# Patient Record
Sex: Female | Born: 1935 | Race: White | Hispanic: No
Health system: Southern US, Community
[De-identification: ages and names within clinical notes are randomized; demographics above are authoritative.]

## PROBLEM LIST (undated history)

## (undated) DIAGNOSIS — I878 Other specified disorders of veins: Secondary | ICD-10-CM

## (undated) DIAGNOSIS — M1990 Unspecified osteoarthritis, unspecified site: Secondary | ICD-10-CM

## (undated) DIAGNOSIS — K219 Gastro-esophageal reflux disease without esophagitis: Secondary | ICD-10-CM

## (undated) DIAGNOSIS — I341 Nonrheumatic mitral (valve) prolapse: Secondary | ICD-10-CM

## (undated) DIAGNOSIS — M810 Age-related osteoporosis without current pathological fracture: Secondary | ICD-10-CM

## (undated) DIAGNOSIS — R609 Edema, unspecified: Secondary | ICD-10-CM

## (undated) DIAGNOSIS — G43909 Migraine, unspecified, not intractable, without status migrainosus: Secondary | ICD-10-CM

## (undated) DIAGNOSIS — L84 Corns and callosities: Secondary | ICD-10-CM

## (undated) DIAGNOSIS — J309 Allergic rhinitis, unspecified: Secondary | ICD-10-CM

## (undated) DIAGNOSIS — D649 Anemia, unspecified: Secondary | ICD-10-CM

## (undated) DIAGNOSIS — G5600 Carpal tunnel syndrome, unspecified upper limb: Secondary | ICD-10-CM

## (undated) DIAGNOSIS — F419 Anxiety disorder, unspecified: Secondary | ICD-10-CM

## (undated) DIAGNOSIS — I10 Essential (primary) hypertension: Secondary | ICD-10-CM

## (undated) DIAGNOSIS — R002 Palpitations: Secondary | ICD-10-CM

## (undated) HISTORY — DX: Gastro-esophageal reflux disease without esophagitis: K21.9

## (undated) HISTORY — DX: Carpal tunnel syndrome, unspecified upper limb: G56.00

## (undated) HISTORY — DX: Migraine, unspecified, not intractable, without status migrainosus: G43.909

## (undated) HISTORY — DX: Anemia, unspecified: D64.9

## (undated) HISTORY — DX: Essential (primary) hypertension: I10

## (undated) HISTORY — DX: Anxiety disorder, unspecified: F41.9

## (undated) HISTORY — DX: Allergic rhinitis, unspecified: J30.9

## (undated) HISTORY — DX: Unspecified osteoarthritis, unspecified site: M19.90

## (undated) HISTORY — DX: Edema, unspecified: R60.9

## (undated) HISTORY — DX: Other specified disorders of veins: I87.8

## (undated) HISTORY — DX: Palpitations: R00.2

## (undated) HISTORY — DX: Age-related osteoporosis without current pathological fracture: M81.0

## (undated) HISTORY — DX: Corns and callosities: L84

## (undated) HISTORY — DX: Nonrheumatic mitral (valve) prolapse: I34.1

## (undated) HISTORY — DX: Hypercalcemia: E83.52

---

## 2006-12-05 ENCOUNTER — Ambulatory Visit: Payer: Self-pay | Admitting: Specialist

## 2006-12-12 ENCOUNTER — Other Ambulatory Visit: Payer: Self-pay

## 2006-12-12 ENCOUNTER — Ambulatory Visit: Payer: Self-pay | Admitting: Specialist

## 2008-09-17 ENCOUNTER — Ambulatory Visit: Payer: Self-pay | Admitting: Cardiology

## 2010-12-08 ENCOUNTER — Ambulatory Visit: Payer: Self-pay | Admitting: Family Medicine

## 2011-02-16 ENCOUNTER — Ambulatory Visit: Payer: Self-pay | Admitting: Family Medicine

## 2011-07-26 ENCOUNTER — Ambulatory Visit: Payer: Self-pay | Admitting: Oncology

## 2011-07-26 LAB — RETICULOCYTES: Reticulocyte: 0.31 % — ABNORMAL LOW (ref 0.5–1.5)

## 2011-07-26 LAB — IRON AND TIBC
Iron Bind.Cap.(Total): 432 ug/dL (ref 250–450)
Unbound Iron-Bind.Cap.: 411 ug/dL

## 2011-07-26 LAB — CBC CANCER CENTER
Basophil #: 0.1 x10 3/mm (ref 0.0–0.1)
Basophil %: 1.4 %
Eosinophil #: 0 x10 3/mm (ref 0.0–0.7)
Eosinophil %: 1.3 %
HCT: 29.2 % — ABNORMAL LOW (ref 35.0–47.0)
HGB: 9 g/dL — ABNORMAL LOW (ref 12.0–16.0)
Lymphocyte #: 0.9 x10 3/mm — ABNORMAL LOW (ref 1.0–3.6)
MCH: 25.8 pg — ABNORMAL LOW (ref 26.0–34.0)
MCHC: 30.9 g/dL — ABNORMAL LOW (ref 32.0–36.0)
MCV: 84 fL (ref 80–100)
Platelet: 211 x10 3/mm (ref 150–440)
WBC: 3.6 x10 3/mm (ref 3.6–11.0)

## 2011-07-26 LAB — FERRITIN: Ferritin (ARMC): 12 ng/mL (ref 8–388)

## 2011-08-13 ENCOUNTER — Ambulatory Visit: Payer: Self-pay | Admitting: Oncology

## 2011-09-12 ENCOUNTER — Ambulatory Visit: Payer: Self-pay | Admitting: Oncology

## 2011-10-17 ENCOUNTER — Ambulatory Visit: Payer: Self-pay | Admitting: Oncology

## 2011-10-17 LAB — CBC CANCER CENTER
Basophil #: 0 x10 3/mm (ref 0.0–0.1)
Basophil %: 1.1 %
Eosinophil %: 1.5 %
HCT: 37.4 % (ref 35.0–47.0)
HGB: 12.3 g/dL (ref 12.0–16.0)
Lymphocyte #: 1 x10 3/mm (ref 1.0–3.6)
MCV: 92 fL (ref 80–100)
Monocyte %: 7.5 %
Neutrophil #: 2.9 x10 3/mm (ref 1.4–6.5)
RBC: 4.06 10*6/uL (ref 3.80–5.20)
RDW: 19.8 % — ABNORMAL HIGH (ref 11.5–14.5)
WBC: 4.3 x10 3/mm (ref 3.6–11.0)

## 2011-10-17 LAB — IRON AND TIBC
Iron Bind.Cap.(Total): 337 ug/dL (ref 250–450)
Unbound Iron-Bind.Cap.: 242 ug/dL

## 2011-11-13 ENCOUNTER — Ambulatory Visit: Payer: Self-pay | Admitting: Oncology

## 2013-10-31 ENCOUNTER — Emergency Department: Payer: Self-pay | Admitting: Emergency Medicine

## 2013-10-31 LAB — CBC WITH DIFFERENTIAL/PLATELET
Basophil #: 0 10*3/uL (ref 0.0–0.1)
Basophil %: 0.8 %
EOS ABS: 0.1 10*3/uL (ref 0.0–0.7)
Eosinophil %: 1.3 %
LYMPHS ABS: 0.5 10*3/uL — AB (ref 1.0–3.6)
LYMPHS PCT: 10.8 %
MONO ABS: 0.5 x10 3/mm (ref 0.2–0.9)
MONOS PCT: 10 %
NEUTROS PCT: 77.1 %
Neutrophil #: 3.8 10*3/uL (ref 1.4–6.5)

## 2013-10-31 LAB — CBC
HCT: 29.7 % — ABNORMAL LOW (ref 35.0–47.0)
HGB: 9.5 g/dL — AB (ref 12.0–16.0)
MCH: 27.7 pg (ref 26.0–34.0)
MCHC: 31.9 g/dL — ABNORMAL LOW (ref 32.0–36.0)
MCV: 87 fL (ref 80–100)
Platelet: 202 10*3/uL (ref 150–440)
RBC: 3.42 10*6/uL — AB (ref 3.80–5.20)
RDW: 15.4 % — ABNORMAL HIGH (ref 11.5–14.5)
WBC: 5 10*3/uL (ref 3.6–11.0)

## 2013-10-31 LAB — COMPREHENSIVE METABOLIC PANEL
ANION GAP: 8 (ref 7–16)
Albumin: 3.2 g/dL — ABNORMAL LOW (ref 3.4–5.0)
Alkaline Phosphatase: 73 U/L
BUN: 49 mg/dL — AB (ref 7–18)
Bilirubin,Total: 0.2 mg/dL (ref 0.2–1.0)
CHLORIDE: 105 mmol/L (ref 98–107)
CO2: 30 mmol/L (ref 21–32)
Calcium, Total: 9.9 mg/dL (ref 8.5–10.1)
Creatinine: 1.4 mg/dL — ABNORMAL HIGH (ref 0.60–1.30)
EGFR (African American): 40 — ABNORMAL LOW
GFR CALC NON AF AMER: 34 — AB
Glucose: 114 mg/dL — ABNORMAL HIGH (ref 65–99)
OSMOLALITY: 299 (ref 275–301)
Potassium: 4.2 mmol/L (ref 3.5–5.1)
SGOT(AST): 15 U/L (ref 15–37)
SGPT (ALT): 13 U/L — ABNORMAL LOW
Sodium: 143 mmol/L (ref 136–145)
TOTAL PROTEIN: 6.4 g/dL (ref 6.4–8.2)

## 2013-10-31 LAB — URINALYSIS, COMPLETE
BLOOD: NEGATIVE
Bilirubin,UR: NEGATIVE
Glucose,UR: NEGATIVE mg/dL (ref 0–75)
Hyaline Cast: 2
Ketone: NEGATIVE
NITRITE: POSITIVE
PH: 5 (ref 4.5–8.0)
Protein: NEGATIVE
RBC,UR: <1 /HPF (ref 0–5)
Specific Gravity: 1.012 (ref 1.003–1.030)
Squamous Epithelial: 5
WBC UR: 7 /HPF (ref 0–5)

## 2013-10-31 LAB — TROPONIN I: Troponin-I: 0.04 ng/mL

## 2014-08-01 ENCOUNTER — Emergency Department: Payer: Medicare Other

## 2014-08-01 ENCOUNTER — Emergency Department
Admission: EM | Admit: 2014-08-01 | Discharge: 2014-08-01 | Disposition: A | Discharge status: Home / Self Care | Payer: Medicare Other | Attending: Emergency Medicine | Admitting: Emergency Medicine

## 2014-08-01 DIAGNOSIS — S0001XA Abrasion of scalp, initial encounter: Secondary | ICD-10-CM | POA: Insufficient documentation

## 2014-08-01 DIAGNOSIS — W010XXA Fall on same level from slipping, tripping and stumbling without subsequent striking against object, initial encounter: Secondary | ICD-10-CM | POA: Insufficient documentation

## 2014-08-01 DIAGNOSIS — Y998 Other external cause status: Secondary | ICD-10-CM | POA: Diagnosis not present

## 2014-08-01 DIAGNOSIS — Y92129 Unspecified place in nursing home as the place of occurrence of the external cause: Secondary | ICD-10-CM | POA: Diagnosis not present

## 2014-08-01 DIAGNOSIS — Y9389 Activity, other specified: Secondary | ICD-10-CM | POA: Diagnosis not present

## 2014-08-01 DIAGNOSIS — S0990XA Unspecified injury of head, initial encounter: Secondary | ICD-10-CM | POA: Diagnosis present

## 2014-08-01 NOTE — ED Notes (Signed)
Pt here from [facility], pt was found on the bathroom. Pt had an unwitnessed fall. Pt has laceration to the back of her head and a small skin teat to the left arm.  Pt has dementia at baseline and pt in NAD at this time.

## 2014-08-01 NOTE — ED Notes (Signed)
Jody from [facility] called for update about patient.  Call back number is [phone].

## 2014-08-01 NOTE — ED Notes (Signed)
Pt has bilateral edema to lower extremities.

## 2014-08-01 NOTE — Discharge Instructions (Signed)
Head Injury °You have received a head injury. It does not appear serious at this time. Headaches and vomiting are common following head injury. It should be easy to awaken from sleeping. Sometimes it is necessary for you to stay in the emergency department for a while for observation. Sometimes admission to the hospital may be needed. After injuries such as yours, most problems occur within the first 24 hours, but side effects may occur up to 7-10 days after the injury. It is important for you to carefully monitor your condition and contact your health care provider or seek immediate medical care if there is a change in your condition. °WHAT ARE THE TYPES OF HEAD INJURIES? °Head injuries can be as minor as a bump. Some head injuries can be more severe. More severe head injuries include: °· A jarring injury to the brain (concussion). °· A bruise of the brain (contusion). This mean there is bleeding in the brain that can cause swelling. °· A cracked skull (skull fracture). °· Bleeding in the brain that collects, clots, and forms a bump (hematoma). °WHAT CAUSES A HEAD INJURY? °A serious head injury is most likely to happen to someone who is in a car wreck and is not wearing a seat belt. Other causes of major head injuries include bicycle or motorcycle accidents, sports injuries, and falls. °HOW ARE HEAD INJURIES DIAGNOSED? °A complete history of the event leading to the injury and your current symptoms will be helpful in diagnosing head injuries. Many times, pictures of the brain, such as CT or MRI are needed to see the extent of the injury. Often, an overnight hospital stay is necessary for observation.  °WHEN SHOULD I SEEK IMMEDIATE MEDICAL CARE?  °You should get help right away if: °· You have confusion or drowsiness. °· You feel sick to your stomach (nauseous) or have continued, forceful vomiting. °· You have dizziness or unsteadiness that is getting worse. °· You have severe, continued headaches not relieved by  medicine. Only take over-the-counter or prescription medicines for pain, fever, or discomfort as directed by your health care provider. °· You do not have normal function of the arms or legs or are unable to walk. °· You notice changes in the black spots in the center of the colored part of your eye (pupil). °· You have a clear or bloody fluid coming from your nose or ears. °· You have a loss of vision. °During the next 24 hours after the injury, you must stay with someone who can watch you for the warning signs. This person should contact local emergency services (911 in the [place] if you have seizures, you become unconscious, or you are unable to wake up. °HOW CAN I PREVENT A HEAD INJURY IN THE FUTURE? °The most important factor for preventing major head injuries is avoiding motor vehicle accidents.  To minimize the potential for damage to your head, it is crucial to wear seat belts while riding in motor vehicles. Wearing helmets while bike riding and playing collision sports (like football) is also helpful. Also, avoiding dangerous activities around the house will further help reduce your risk of head injury.  °WHEN CAN I RETURN TO NORMAL ACTIVITIES AND ATHLETICS? °You should be reevaluated by your health care provider before returning to these activities. If you have any of the following symptoms, you should not return to activities or contact sports until 1 week after the symptoms have stopped: °· Persistent headache. °· Dizziness or vertigo. °· Poor attention and concentration. °· Confusion. °·   Memory problems.  Nausea or vomiting.  Fatigue or tire easily.  Irritability.  Intolerant of bright lights or loud noises.  Anxiety or depression.  Disturbed sleep. MAKE SURE YOU:   Understand these instructions.  Will watch your condition.  Will get help right away if you are not doing well or get worse. Document Released: 02/28/2005 Document Revised: 03/05/2013 Document Reviewed:  11/05/2012 [facility] Patient Information 2015 [place], [place]. This information is not intended to replace advice given to you by your health care provider. Make sure you discuss any questions you have with your health care provider.  Head Injury You have received a head injury. It does not appear serious at this time. Headaches and vomiting are common following head injury. It should be easy to awaken from sleeping. Sometimes it is necessary for you to stay in the emergency department for a while for observation. Sometimes admission to the hospital may be needed. After injuries such as yours, most problems occur within the first 24 hours, but side effects may occur up to 7-10 days after the injury. It is important for you to carefully monitor your condition and contact your health care provider or seek immediate medical care if there is a change in your condition. WHAT ARE THE TYPES OF HEAD INJURIES? Head injuries can be as minor as a bump. Some head injuries can be more severe. More severe head injuries include:  A jarring injury to the brain (concussion).  A bruise of the brain (contusion). This mean there is bleeding in the brain that can cause swelling.  A cracked skull (skull fracture).  Bleeding in the brain that collects, clots, and forms a bump (hematoma). WHAT CAUSES A HEAD INJURY? A serious head injury is most likely to happen to someone who is in a car wreck and is not wearing a seat belt. Other causes of major head injuries include bicycle or motorcycle accidents, sports injuries, and falls. HOW ARE HEAD INJURIES DIAGNOSED? A complete history of the event leading to the injury and your current symptoms will be helpful in diagnosing head injuries. Many times, pictures of the brain, such as CT or MRI are needed to see the extent of the injury. Often, an overnight hospital stay is necessary for observation.  WHEN SHOULD I SEEK IMMEDIATE MEDICAL CARE?  You should get help right away  if:  You have confusion or drowsiness.  You feel sick to your stomach (nauseous) or have continued, forceful vomiting.  You have dizziness or unsteadiness that is getting worse.  You have severe, continued headaches not relieved by medicine. Only take over-the-counter or prescription medicines for pain, fever, or discomfort as directed by your health care provider.  You do not have normal function of the arms or legs or are unable to walk.  You notice changes in the black spots in the center of the colored part of your eye (pupil).  You have a clear or bloody fluid coming from your nose or ears.  You have a loss of vision. During the next 24 hours after the injury, you must stay with someone who can watch you for the warning signs. This person should contact local emergency services (911 in the [place] if you have seizures, you become unconscious, or you are unable to wake up. HOW CAN I PREVENT A HEAD INJURY IN THE FUTURE? The most important factor for preventing major head injuries is avoiding motor vehicle accidents. To minimize the potential for damage to your head, it is crucial to  wear seat belts while riding in motor vehicles. Wearing helmets while bike riding and playing collision sports (like football) is also helpful. Also, avoiding dangerous activities around the house will further help reduce your risk of head injury.  WHEN CAN I RETURN TO NORMAL ACTIVITIES AND ATHLETICS? You should be reevaluated by your health care provider before returning to these activities. If you have any of the following symptoms, you should not return to activities or contact sports until 1 week after the symptoms have stopped:  Persistent headache.  Dizziness or vertigo.  Poor attention and concentration.  Confusion.  Memory problems.  Nausea or vomiting.  Fatigue or tire easily.  Irritability.  Intolerant of bright lights or loud noises.  Anxiety or depression.  Disturbed sleep. MAKE  SURE YOU:   Understand these instructions.  Will watch your condition.  Will get help right away if you are not doing well or get worse. Document Released: 02/28/2005 Document Revised: 03/05/2013 Document Reviewed: 11/05/2012 [facility] Patient Information 2015 [place], [place]. This information is not intended to replace advice given to you by your health care provider. Make sure you discuss any questions you have with your health care provider.

## 2014-08-01 NOTE — ED Notes (Signed)
Pt here after fall at assisted living, pt in NAD at this time. Pt has small laceration to the left side of head and skin tear to the left arm. Bleeding controlled at this time.

## 2014-08-01 NOTE — ED Provider Notes (Signed)
[facility] Emergency Department Provider Note  ____________________________________________  Time seen: 2:35 AM  I have reviewed the triage vital signs and the nursing notes.   HISTORY  Chief Complaint Fall      HPI Colleen Miranda is a 81 y.o. female presents with history of slip [facility]. Patient struck left parietal regions: Fall. Per EMS patient had baseline at present.    No past medical history on file.  There are no active problems to display for this patient.   No past surgical history on file.  No current outpatient prescriptions on file.  Allergies Review of patient's allergies indicates not on file.  No family history on file.  Social History History  Substance Use Topics  . Smoking status: Not on file  . Smokeless tobacco: Not on file  . Alcohol Use: Not on file    Review of Systems  Constitutional: Negative for fever. Eyes: Negative for visual changes. ENT: Negative for sore throat. Cardiovascular: Negative for chest pain. Respiratory: Negative for shortness of breath. Gastrointestinal: Negative for abdominal pain, vomiting and diarrhea. Genitourinary: Negative for dysuria. Musculoskeletal: Negative for back pain. Skin: Negative for rash. Abrasion to her scalp Neurological: Negative for headaches, focal weakness or numbness.   10-point ROS otherwise negative.  ____________________________________________   PHYSICAL EXAM:  VITAL SIGNS: ED Triage Vitals  Enc Vitals Group     BP 08/01/14 0231 102/43 mmHg     Pulse Rate 08/01/14 0231 83     Resp 08/01/14 0231 18     Temp 08/01/14 0231 97.9 F (36.6 C)     Temp Source 08/01/14 0231 Oral     SpO2 08/01/14 0231 95 %     Weight --      Height --      Head Cir --      Peak Flow --      Pain Score 08/01/14 0231 0     Pain Loc --      Pain Edu? --      Excl. in GC? --      Constitutional: Alert and  oriented. Well appearing and in no distress. Eyes: Conjunctivae are normal. PERRL. Normal extraocular movements. ENT   Head: Normocephalic and atraumatic.   Nose: No congestion/rhinnorhea.   Mouth/Throat: Mucous membranes are moist.   Neck: No stridor. Cardiovascular: Normal rate, regular rhythm. Normal and symmetric distal pulses are present in all extremities. No murmurs, rubs, or gallops. Respiratory: Normal respiratory effort without tachypnea nor retractions. Breath sounds are clear and equal bilaterally. No wheezes/rales/rhonchi. Gastrointestinal: Soft and nontender. No distention. There is no CVA tenderness. Genitourinary: deferred Musculoskeletal: Nontender with normal range of motion in all extremities. No joint effusions.  No lower extremity tenderness nor edema. Neurologic:  Normal speech and language. No gross focal neurologic deficits are appreciated. Speech is normal.  Skin:  Skin is warm, dry and intact. No rash noted. Left parietal parietal scalp abrasion Psychiatric: Mood and affect are normal. Speech and behavior are normal. Patient exhibits appropriate insight and judgment.  ____________________________________________      RADIOLOGY  CT head and cervical spine reveal no acute pathology.  ____________________________________________     INITIAL IMPRESSION / ASSESSMENT AND PLAN / ED COURSE  Pertinent labs & imaging results that were available during my care of the patient were reviewed by me and considered in my medical decision making (see chart for details).  History of physical exam consistent with accidental slip  and fall with resultant left parietal scalp abrasion.  ____________________________________________   FINAL CLINICAL IMPRESSION(S) / ED DIAGNOSES  Final diagnoses:  Scalp abrasion, initial encounter      Darci Currentandolph N Dayanne Yiu, MD 08/01/14 [phone]

## 2014-08-14 ENCOUNTER — Telehealth: Payer: Self-pay

## 2014-08-14 NOTE — Telephone Encounter (Signed)
[facility] called stated pt is very anxious and needs to be seen ASAP. I explained to the caregiver our current schedule is tight and there are no openings until the week of 08/25/14. Caregiver stated MAC told her to tell the scheduler to create room for this pt to be seen. Caregiver asks that MAC return her call Colleen Miranda(Colleen Miranda-Director @ [facility]) [phone]. Thanks.

## 2014-08-14 NOTE — Telephone Encounter (Signed)
Spoke with Liborio NixonJanice and informed that MAC to call family

## 2014-08-14 NOTE — Telephone Encounter (Signed)
Call and discuss will talk with family

## 2014-08-25 ENCOUNTER — Encounter: Payer: Medicare Other | Admitting: Family Medicine

## 2014-08-25 NOTE — Progress Notes (Signed)
This encounter was created in error - please disregard.

## 2014-09-06 ENCOUNTER — Encounter: Payer: Self-pay | Admitting: Emergency Medicine

## 2014-09-06 ENCOUNTER — Emergency Department
Admission: EM | Admit: 2014-09-06 | Discharge: 2014-09-06 | Disposition: A | Discharge status: Home / Self Care | Payer: Medicare Other | Attending: Emergency Medicine | Admitting: Emergency Medicine

## 2014-09-06 DIAGNOSIS — Z7982 Long term (current) use of aspirin: Secondary | ICD-10-CM | POA: Insufficient documentation

## 2014-09-06 DIAGNOSIS — Z043 Encounter for examination and observation following other accident: Secondary | ICD-10-CM | POA: Diagnosis not present

## 2014-09-06 DIAGNOSIS — I10 Essential (primary) hypertension: Secondary | ICD-10-CM | POA: Insufficient documentation

## 2014-09-06 DIAGNOSIS — W1839XA Other fall on same level, initial encounter: Secondary | ICD-10-CM | POA: Diagnosis not present

## 2014-09-06 DIAGNOSIS — Y998 Other external cause status: Secondary | ICD-10-CM | POA: Diagnosis not present

## 2014-09-06 DIAGNOSIS — Y9389 Activity, other specified: Secondary | ICD-10-CM | POA: Insufficient documentation

## 2014-09-06 DIAGNOSIS — Z79899 Other long term (current) drug therapy: Secondary | ICD-10-CM | POA: Insufficient documentation

## 2014-09-06 DIAGNOSIS — Y9289 Other specified places as the place of occurrence of the external cause: Secondary | ICD-10-CM | POA: Insufficient documentation

## 2014-09-06 DIAGNOSIS — W19XXXA Unspecified fall, initial encounter: Secondary | ICD-10-CM

## 2014-09-06 NOTE — ED Notes (Signed)
Patient from [facility]. Patient was pushing on a door to go to the restroom when someone else "pulled on the same door" causing her to fall. Patient states "I was only on the floor for 5-10 minutes". Patient denies Pain, Discomfort, or LOC. Here for evaluation only per request of [facility] staff

## 2014-09-06 NOTE — Discharge Instructions (Signed)
Fall Prevention and Home Safety °Falls cause injuries and can affect all age groups. It is possible to prevent falls.  °HOW TO PREVENT FALLS °· Wear shoes with rubber soles that do not have an opening for your toes. °· Keep the inside and outside of your house well lit. °· Use night lights throughout your home. °· Remove clutter from floors. °· Clean up floor spills. °· Remove throw rugs or fasten them to the floor with carpet tape. °· Do not place electrical cords across pathways. °· Put grab bars by your tub, shower, and toilet. Do not use towel bars as grab bars. °· Put handrails on both sides of the stairway. Fix loose handrails. °· Do not climb on stools or stepladders, if possible. °· Do not wax your floors. °· Repair uneven or unsafe sidewalks, walkways, or stairs. °· Keep items you use a lot within reach. °· Be aware of pets. °· Keep emergency numbers next to the telephone. °· Put smoke detectors in your home and near bedrooms. °Ask your doctor what other things you can do to prevent falls. °Document Released: 12/25/2008 Document Revised: 08/30/2011 Document Reviewed: 05/31/2011 °ExitCare® Patient Information ©2015 ExitCare, LLC. This information is not intended to replace advice given to you by your health care provider. Make sure you discuss any questions you have with your health care provider. ° °

## 2014-09-06 NOTE — ED Provider Notes (Signed)
[facility] Emergency Department Provider Note  ____________________________________________  Time seen: 1625  I have reviewed the triage vital signs and the nursing notes.   HISTORY  Chief Complaint Fall     HPI Colleen Miranda is a 81 y.o. female who lives at [facility]. She walks with a walker. She approached the door to a bathroom. She did not know someone was inside or about to open the door. While she pushed on the door to open it the door opened from the other side and this threw her off balance. She fell to the ground slowly with some support from the walker. She denies hitting her head. She did not have any injury to arms or legs according to the patient. The staff at [facility] did call 911. The first responders immobilized patient on a backboard with a c-collar. This 81 year old was uncomfortable with that and had tried to tell him that she was not injured. Here in the emergency department, she is alert and communicative and continues to deny any acute injury.    Past Medical History  Diagnosis Date  . Arthritis   . Osteoporosis   . Venous stasis   . GERD (gastroesophageal reflux disease)   . Migraine   . Allergic rhinitis   . Anemia   . Edema   . Hypertension   . Palpitations   . MVP (mitral valve prolapse)   . Anxiety   . Corns and callosities   . Carpal tunnel syndrome   . Hypercalcemia     There are no active problems to display for this patient.   History reviewed. No pertinent past surgical history.  Current Outpatient Rx  Name  Route  Sig  Dispense  Refill  . aspirin EC 81 MG tablet   Oral   Take 81 mg by mouth daily.         . Cholecalciferol (VITAMIN D-3 PO)   Oral   Take by mouth.         . furosemide (LASIX) 20 MG tablet   Oral   Take 20 mg by mouth.         Marland Kitchen lisinopril (PRINIVIL,ZESTRIL) 5 MG tablet   Oral   Take 5 mg by mouth daily.         . metoprolol succinate  (TOPROL-XL) 25 MG 24 hr tablet   Oral   Take 25 mg by mouth daily.         Marland Kitchen omeprazole (PRILOSEC OTC) 20 MG tablet   Oral   Take 20 mg by mouth daily.           Allergies Naprosyn  Family History  Problem Relation Age of Onset  . Family history unknown: Yes    Social History History  Substance Use Topics  . Smoking status: Not on file  . Smokeless tobacco: Not on file  . Alcohol Use: Not on file    Review of Systems  Constitutional: Negative for fever. ENT: Negative for sore throat. Cardiovascular: Negative for chest pain. Respiratory: Negative for shortness of breath. Gastrointestinal: Negative for abdominal pain, vomiting and diarrhea. Genitourinary: Negative for dysuria. Musculoskeletal: No myalgias or injuries. Skin: Negative for rash. Neurological: Negative for headaches   10-point ROS otherwise negative.  ____________________________________________   PHYSICAL EXAM:  VITAL SIGNS: ED Triage Vitals  Enc Vitals Group     BP 09/06/14 1623 122/58 mmHg     Pulse Rate 09/06/14 1623 71     Resp --  Temp 09/06/14 1623 97.8 F (36.6 C)     Temp Source 09/06/14 1623 Oral     SpO2 09/06/14 1623 92 %     Weight 09/06/14 1623 112 lb 14.4 oz (51.211 kg)     Height --      Head Cir --      Peak Flow --      Pain Score --      Pain Loc --      Pain Edu? --      Excl. in GC? --     Constitutional:  Alert and communicative. She has a strong accent and is a little difficult to understand but she has an intact thought process and denies any acute injury. ENT   Head: Normocephalic and atraumatic.   Nose: No congestion/rhinnorhea.   Mouth/Throat: Mucous membranes are moist. Cardiovascular: Normal rate, regular rhythm, no murmur noted Respiratory:  Normal respiratory effort, no tachypnea.    Breath sounds are clear and equal bilaterally.  Gastrointestinal: Soft and nontender. No distention.  Back: No muscle spasm, no tenderness, no CVA  tenderness. Noted kyphosis. Musculoskeletal: No deformity noted. Nontender with normal range of motion in all extremities.  No noted edema. Hips are stable. No tenderness, no crepitus. Good range of motion of her legs. Neurologic:  Normal speech and language. No gross focal neurologic deficits are appreciated.  Skin:  Skin is warm, dry. No rash noted. No abrasions, no lacerations. Psychiatric: Mood and affect are normal. Speech and behavior are normal.  ____________________________________________    INITIAL IMPRESSION / ASSESSMENT AND PLAN / ED COURSE  Well-appearing patient who has been cleared from the backboard. She is able to sit up on her own. She has no neck tenderness. She has no focal injury. She is looking forward or turning to [facility].     ____________________________________________   FINAL CLINICAL IMPRESSION(S) / ED DIAGNOSES  Final diagnoses:  Fall, initial encounter      Darien Ramus, MD 09/06/14 [phone]

## 2014-09-09 ENCOUNTER — Ambulatory Visit: Payer: Medicare Other | Admitting: Family Medicine

## 2014-10-24 ENCOUNTER — Telehealth: Payer: Self-pay

## 2014-10-24 NOTE — Telephone Encounter (Signed)
[facility] needs clarification on medications sent to the pharmacy. Also wants to know if eye drops can be changed to something less expensive. Pt is in an assisted living facility. Need very specific orders on all medications. Thanks.

## 2014-10-27 ENCOUNTER — Other Ambulatory Visit: Payer: Self-pay | Admitting: Family Medicine

## 2014-10-27 MED ORDER — FUROSEMIDE 20 MG PO TABS: 20.0000 mg | ORAL_TABLET | Freq: Every day | ORAL | Status: DC

## 2014-10-27 MED ORDER — LISINOPRIL 5 MG PO TABS: 5.0000 mg | ORAL_TABLET | Freq: Every day | ORAL | Status: DC

## 2014-10-27 MED ORDER — METOPROLOL SUCCINATE ER 25 MG PO TB24: 25.0000 mg | ORAL_TABLET | Freq: Every day | ORAL | Status: AC

## 2014-10-27 MED ORDER — OMEPRAZOLE MAGNESIUM 20 MG PO TBEC: 20.0000 mg | DELAYED_RELEASE_TABLET | Freq: Every day | ORAL | Status: DC

## 2015-01-22 ENCOUNTER — Telehealth: Payer: Self-pay | Admitting: Family Medicine

## 2015-01-22 ENCOUNTER — Other Ambulatory Visit: Payer: Self-pay | Admitting: Family Medicine

## 2015-01-22 MED ORDER — DIPHENHYDRAMINE HCL 25 MG PO CAPS: 25.0000 mg | ORAL_CAPSULE | Freq: Every evening | ORAL | Status: DC | PRN

## 2015-01-22 NOTE — Progress Notes (Unsigned)
Will try Benadryl for sleep

## 2015-01-22 NOTE — Telephone Encounter (Signed)
[address]

## 2015-01-22 NOTE — Telephone Encounter (Signed)
I will order Benadryl don't know where to send

## 2015-01-22 NOTE — Addendum Note (Signed)
Addended byVonita Moss: Charlynn Salih on: 01/22/2015 04:26 PM   Modules accepted: Orders

## 2015-01-22 NOTE — Telephone Encounter (Signed)
Pt refuses to take remron because she has nightmares and hallucinations and they would like to know if the pt could have something else to take.

## 2015-01-22 NOTE — Telephone Encounter (Signed)
Routing to provider.   Patient refuses to take remron (mirtazipine) because she has nightmares and hallucinations.Caregivers want to know if she could have something else.  LAST VISIT: 03/13/2014 She had another appointment 09/09/2014--but no showed.   I do not see in practice partner or in epic where Dr. Dossie Arbourrissman prescribed remron. (in history either). Not sure if I'm not looking hard enough or not.    Practice Partner # [phone]

## 2015-06-20 ENCOUNTER — Emergency Department: Payer: Medicare Other

## 2015-06-20 ENCOUNTER — Encounter: Payer: Self-pay | Admitting: Emergency Medicine

## 2015-06-20 ENCOUNTER — Emergency Department
Admission: EM | Admit: 2015-06-20 | Discharge: 2015-06-20 | Disposition: A | Discharge status: Skilled Nursing Facility | Payer: Medicare Other | Source: Home / Self Care | Attending: Emergency Medicine | Admitting: Emergency Medicine

## 2015-06-20 DIAGNOSIS — S0093XA Contusion of unspecified part of head, initial encounter: Secondary | ICD-10-CM

## 2015-06-20 DIAGNOSIS — W06XXXA Fall from bed, initial encounter: Secondary | ICD-10-CM

## 2015-06-20 DIAGNOSIS — Y939 Activity, unspecified: Secondary | ICD-10-CM | POA: Insufficient documentation

## 2015-06-20 DIAGNOSIS — W228XXA Striking against or struck by other objects, initial encounter: Secondary | ICD-10-CM

## 2015-06-20 DIAGNOSIS — Y929 Unspecified place or not applicable: Secondary | ICD-10-CM

## 2015-06-20 DIAGNOSIS — W19XXXA Unspecified fall, initial encounter: Secondary | ICD-10-CM

## 2015-06-20 DIAGNOSIS — K219 Gastro-esophageal reflux disease without esophagitis: Secondary | ICD-10-CM | POA: Insufficient documentation

## 2015-06-20 DIAGNOSIS — M1990 Unspecified osteoarthritis, unspecified site: Secondary | ICD-10-CM | POA: Insufficient documentation

## 2015-06-20 DIAGNOSIS — S41112A Laceration without foreign body of left upper arm, initial encounter: Secondary | ICD-10-CM | POA: Insufficient documentation

## 2015-06-20 DIAGNOSIS — Y999 Unspecified external cause status: Secondary | ICD-10-CM

## 2015-06-20 DIAGNOSIS — F0390 Unspecified dementia without behavioral disturbance: Secondary | ICD-10-CM | POA: Diagnosis not present

## 2015-06-20 DIAGNOSIS — R51 Headache: Secondary | ICD-10-CM | POA: Insufficient documentation

## 2015-06-20 DIAGNOSIS — I10 Essential (primary) hypertension: Secondary | ICD-10-CM

## 2015-06-20 DIAGNOSIS — I213 ST elevation (STEMI) myocardial infarction of unspecified site: Secondary | ICD-10-CM | POA: Diagnosis not present

## 2015-06-20 NOTE — ED Notes (Signed)
[facility] to [facility].

## 2015-06-20 NOTE — ED Notes (Signed)
[facility] present to [facility].

## 2015-06-20 NOTE — ED Notes (Signed)
[facility] after an unwitnessed fall. Pt's roommate heard her fall and pushed the room alarm. Pt has no complaints of any pain at this time. No obvious injuries.

## 2015-06-20 NOTE — ED Provider Notes (Signed)
[facility] Emergency Department Provider Note  ____________________________________________  Time seen: Approximately 5:30 AM  I have reviewed the triage vital signs and the nursing notes.   HISTORY  Chief Complaint Fall  History limited by chronic dementia  HPI Colleen Miranda is a 82 y.o. female with an apparent history of dementia who lives at an assisted living facility.  She presents by EMS for evaluation after an unwitnessed fall.  She reportedly requires a great deal of assistance normally but she woke up in the middle of the night and decided to go for a walk.  She fell immediately after getting out of the bed and her roommate was able to hit the alarm after just a couple of minutes.  She reports that she did not lose consciousness but she did hit the left side of her head has a hematoma.  She currently says that she is sore but has no specific areas of pain or tenderness.  The onset was acute and the symptoms are mild at this time.   Past Medical History  Diagnosis Date  . Arthritis   . Osteoporosis   . Venous stasis   . GERD (gastroesophageal reflux disease)   . Migraine   . Allergic rhinitis   . Anemia   . Edema   . Hypertension   . Palpitations   . MVP (mitral valve prolapse)   . Anxiety   . Corns and callosities   . Carpal tunnel syndrome   . Hypercalcemia     There are no active problems to display for this patient.   History reviewed. No pertinent past surgical history.  Current Outpatient Rx  Name  Route  Sig  Dispense  Refill  . acetaminophen (TYLENOL) 325 MG tablet   Oral   Take 325 mg by mouth every 6 (six) hours as needed for mild pain, fever or headache.         Marland Kitchen aspirin EC 81 MG tablet   Oral   Take 81 mg by mouth daily.         . cholecalciferol (VITAMIN D) 400 units TABS tablet   Oral   Take 400 Units by mouth.         . furosemide (LASIX) 20 MG tablet   Oral   Take 1 tablet (20 mg total) by  mouth daily. Patient taking differently: Take 20 mg by mouth every other day.    30 tablet   7   . lisinopril (PRINIVIL,ZESTRIL) 5 MG tablet   Oral   Take 1 tablet (5 mg total) by mouth daily.   30 tablet   7   . Magnesium Sulfate (EPSOM SALT) POWD   Does not apply   1 application by Does not apply route daily. Soak feet with warm water for 20 minutes         . Menthol, Topical Analgesic, 5 % PADS   Apply externally   Apply 1 patch topically 4 (four) times daily.         . metoprolol succinate (TOPROL-XL) 25 MG 24 hr tablet   Oral   Take 1 tablet (25 mg total) by mouth daily.   30 tablet   7   . omeprazole (PRILOSEC OTC) 20 MG tablet   Oral   Take 1 tablet (20 mg total) by mouth daily.   30 tablet   7     Allergies Naprosyn  Family History  Problem Relation Age of Onset  . Family history unknown:  Yes    Social History Social History  Substance Use Topics  . Smoking status: None  . Smokeless tobacco: None  . Alcohol Use: None    Review of Systems Constitutional: No fever/chills Eyes: No visual changes. ENT: No sore throat. Cardiovascular: Denies chest pain. Respiratory: Denies shortness of breath. Gastrointestinal: No abdominal pain.  No nausea, no vomiting.  No diarrhea.  No constipation. Genitourinary: Negative for dysuria. Musculoskeletal: Negative for back pain. No hip pain Skin: Negative for rash. Neurological: Negative for headaches, focal weakness or numbness.  10-point ROS otherwise negative.  ____________________________________________   PHYSICAL EXAM:  VITAL SIGNS: ED Triage Vitals  Enc Vitals Group     BP 06/20/15 0522 132/46 mmHg     Pulse Rate 06/20/15 0522 74     Resp 06/20/15 0522 17     Temp 06/20/15 0522 98.7 F (37.1 C)     Temp Source 06/20/15 0522 Oral     SpO2 06/20/15 0522 92 %     Weight 06/20/15 0522 110 lb (49.896 kg)     Height 06/20/15 0522  (1.397 m)     Head Cir --      Peak Flow --      Pain Score  06/20/15 0523 0     Pain Loc --      Pain Edu? --      Excl. in GC? --     Constitutional: Alert and oriented. Quite elderly and frail appearing but in no acute distress. Eyes: Conjunctivae are normal. PERRL. EOMI. Head: Atraumatic. Nose: No congestion/rhinnorhea. Mouth/Throat: Mucous membranes are moist.  Oropharynx non-erythematous. Neck: No stridor.  No meningeal signs.  No cervical spine tenderness to palpation. Cardiovascular: Normal rate, regular rhythm. Good peripheral circulation. Grossly normal heart sounds.   Respiratory: Normal respiratory effort.  No retractions. Lungs CTAB. Gastrointestinal: Soft and nontender. No distention.  Musculoskeletal: No lower extremity tenderness nor edema. No gross deformities of extremities. I fully ranged her lower extremities and upper extremities and there is no pain nor tenderness with the ROM. Neurologic:  Normal speech and language. No gross focal neurologic deficits are appreciated.  Skin:  Skin is warm, dry and intact. No rash noted.   ____________________________________________   LABS (all labs ordered are listed, but only abnormal results are displayed)  Labs Reviewed - No data to display ____________________________________________  EKG  ED ECG REPORT I, [place], the attending physician, personally viewed and interpreted this ECG.   Date: 06/20/2015  EKG Time: 05:47  Rate: 75  Rhythm: LBBB  Axis: Normal  Intervals:Borderline prolonged PR interval and left bundle branch block  ST&T Change: Non-specific ST segment / T-wave changes, but no evidence of acute ischemia.  The patient's EKG is not significantly changed from prior.  ____________________________________________  RADIOLOGY   Ct Head Wo Contrast  06/20/2015  CLINICAL DATA:  Unwitnessed fall. EXAM: CT HEAD WITHOUT CONTRAST CT CERVICAL SPINE WITHOUT CONTRAST TECHNIQUE: Multidetector CT imaging of the head and cervical spine was performed following the  standard protocol without intravenous contrast. Multiplanar CT image reconstructions of the cervical spine were also generated. COMPARISON:  08/01/2014 FINDINGS: CT HEAD FINDINGS There is no intracranial hemorrhage, mass or evidence of acute infarction. There is mild generalized atrophy. There is mild chronic microvascular ischemic change. There is no significant extra-axial fluid collection. No acute intracranial findings are evident. The calvarium and skullbase are intact. Visible paranasal sinuses are clear. CT CERVICAL SPINE FINDINGS The cervical vertebrae are normal in height. The vertebral column,  pedicles and facet articulations are intact. Moderate degenerative changes are present, and there are unchanged severe degenerative changes at the atlantoaxial articulation. There is mild anterior compression of T5 which may be recent, based on reparative sclerosis. However, it is not acute. This is a stable mild compression. IMPRESSION: 1. Negative for acute intracranial traumatic injury. There is mild generalized atrophy and chronic white matter hypodensity which likely represents small vessel disease. 2. Negative for acute cervical spine fracture. 3. Subacute mild anterior compression of T5, probably several weeks to several months old. 4. Mild thyroid enlargement and nodularity noted, unchanged. Electronically Signed   By: Ellery Plunkaniel R Mitchell M.D.   On: 06/20/2015 06:31   Ct Cervical Spine Wo Contrast  06/20/2015  CLINICAL DATA:  Unwitnessed fall. EXAM: CT HEAD WITHOUT CONTRAST CT CERVICAL SPINE WITHOUT CONTRAST TECHNIQUE: Multidetector CT imaging of the head and cervical spine was performed following the standard protocol without intravenous contrast. Multiplanar CT image reconstructions of the cervical spine were also generated. COMPARISON:  08/01/2014 FINDINGS: CT HEAD FINDINGS There is no intracranial hemorrhage, mass or evidence of acute infarction. There is mild generalized atrophy. There is mild chronic  microvascular ischemic change. There is no significant extra-axial fluid collection. No acute intracranial findings are evident. The calvarium and skullbase are intact. Visible paranasal sinuses are clear. CT CERVICAL SPINE FINDINGS The cervical vertebrae are normal in height. The vertebral column, pedicles and facet articulations are intact. Moderate degenerative changes are present, and there are unchanged severe degenerative changes at the atlantoaxial articulation. There is mild anterior compression of T5 which may be recent, based on reparative sclerosis. However, it is not acute. This is a stable mild compression. IMPRESSION: 1. Negative for acute intracranial traumatic injury. There is mild generalized atrophy and chronic white matter hypodensity which likely represents small vessel disease. 2. Negative for acute cervical spine fracture. 3. Subacute mild anterior compression of T5, probably several weeks to several months old. 4. Mild thyroid enlargement and nodularity noted, unchanged. Electronically Signed   By: Ellery Plunkaniel R Mitchell M.D.   On: 06/20/2015 06:31    ____________________________________________   PROCEDURES  Procedure(s) performed: None  Critical Care performed: No ____________________________________________   INITIAL IMPRESSION / ASSESSMENT AND PLAN / ED COURSE  Pertinent labs & imaging results that were available during my care of the patient were reviewed by me and considered in my medical decision making (see chart for details).  No indication of acute injury. No acute pain nor distress.  Reassuring CT scans of head and cervical spine.  No indication for imaging of extremities.  Will d/c back to facility.  ____________________________________________  FINAL CLINICAL IMPRESSION(S) / ED DIAGNOSES  Final diagnoses:  Head contusion, initial encounter  Fall, initial encounter  Skin tear of left upper arm without complication, initial encounter      NEW MEDICATIONS  STARTED DURING THIS VISIT:  New Prescriptions   No medications on file      Note:  This document was prepared using Dragon voice recognition software and may include unintentional dictation errors.   Loleta Roseory Dary Dilauro, MD 06/20/15 [phone]

## 2015-06-20 NOTE — Discharge Instructions (Signed)
You have been seen in the Emergency Department (ED) today for a fall.  Your work up does not show any concerning injuries.  Please take over-the-counter ibuprofen and/or Tylenol as needed for your pain (unless you have an allergy or your doctor as told you not to take them), or take any prescribed medication as instructed.  Change the bandage on your arm wound twice a day and keep it clean and dry.  Please follow up with your doctor regarding today's Emergency Department (ED) visit and your recent fall.    Return to the ED if you have any headache, confusion, slurred speech, weakness/numbness of any arm or leg, or any increased pain.   Facial or Scalp Contusion A facial or scalp contusion is a deep bruise on the face or head. Injuries to the face and head generally cause a lot of swelling, especially around the eyes. Contusions are the result of an injury that caused bleeding under the skin. The contusion may turn blue, purple, or yellow. Minor injuries will give you a painless contusion, but more severe contusions may stay painful and swollen for a few weeks.  CAUSES  A facial or scalp contusion is caused by a blunt injury or trauma to the face or head area.  SIGNS AND SYMPTOMS   Swelling of the injured area.   Discoloration of the injured area.   Tenderness, soreness, or pain in the injured area.  DIAGNOSIS  The diagnosis can be made by taking a medical history and doing a physical exam. An X-ray exam, CT scan, or MRI may be needed to determine if there are any associated injuries, such as broken bones (fractures). TREATMENT  Often, the best treatment for a facial or scalp contusion is applying cold compresses to the injured area. Over-the-counter medicines may also be recommended for pain control.  HOME CARE INSTRUCTIONS   Only take over-the-counter or prescription medicines as directed by your health care provider.   Apply ice to the injured area.   Put ice in a plastic bag.    Place a towel between your skin and the bag.   Leave the ice on for 20 minutes, 2-3 times a day.  SEEK MEDICAL CARE IF:  You have bite problems.   You have pain with chewing.   You are concerned about facial defects. SEEK IMMEDIATE MEDICAL CARE IF:  You have severe pain or a headache that is not relieved by medicine.   You have unusual sleepiness, confusion, or personality changes.   You throw up (vomit).   You have a persistent nosebleed.   You have double vision or blurred vision.   You have fluid drainage from your nose or ear.   You have difficulty walking or using your arms or legs.  MAKE SURE YOU:   Understand these instructions.  Will watch your condition.  Will get help right away if you are not doing well or get worse.   This information is not intended to replace advice given to you by your health care provider. Make sure you discuss any questions you have with your health care provider.   Document Released: 04/07/2004 Document Revised: 03/21/2014 Document Reviewed: 10/11/2012 Elsevier Interactive Patient Education [employer]2016 [employer]vier Inc.

## 2015-06-20 NOTE — ED Notes (Signed)
Discussed discharge instructions and follow-up care with the patient. No questions or concerns at this time. Pt stable at discharge.  

## 2015-06-22 ENCOUNTER — Emergency Department: Payer: Medicare Other

## 2015-06-22 DIAGNOSIS — N179 Acute kidney failure, unspecified: Secondary | ICD-10-CM | POA: Diagnosis present

## 2015-06-22 DIAGNOSIS — I447 Left bundle-branch block, unspecified: Secondary | ICD-10-CM | POA: Diagnosis present

## 2015-06-22 DIAGNOSIS — R579 Shock, unspecified: Secondary | ICD-10-CM | POA: Diagnosis not present

## 2015-06-22 DIAGNOSIS — M1990 Unspecified osteoarthritis, unspecified site: Secondary | ICD-10-CM | POA: Diagnosis present

## 2015-06-22 DIAGNOSIS — J9601 Acute respiratory failure with hypoxia: Secondary | ICD-10-CM | POA: Diagnosis present

## 2015-06-22 DIAGNOSIS — N390 Urinary tract infection, site not specified: Secondary | ICD-10-CM | POA: Diagnosis present

## 2015-06-22 DIAGNOSIS — K219 Gastro-esophageal reflux disease without esophagitis: Secondary | ICD-10-CM | POA: Diagnosis present

## 2015-06-22 DIAGNOSIS — I214 Non-ST elevation (NSTEMI) myocardial infarction: Secondary | ICD-10-CM

## 2015-06-22 DIAGNOSIS — Z66 Do not resuscitate: Secondary | ICD-10-CM | POA: Diagnosis present

## 2015-06-22 DIAGNOSIS — J811 Chronic pulmonary edema: Secondary | ICD-10-CM | POA: Diagnosis present

## 2015-06-22 DIAGNOSIS — Z888 Allergy status to other drugs, medicaments and biological substances status: Secondary | ICD-10-CM

## 2015-06-22 DIAGNOSIS — J810 Acute pulmonary edema: Secondary | ICD-10-CM

## 2015-06-22 DIAGNOSIS — I213 ST elevation (STEMI) myocardial infarction of unspecified site: Secondary | ICD-10-CM | POA: Diagnosis present

## 2015-06-22 DIAGNOSIS — I10 Essential (primary) hypertension: Secondary | ICD-10-CM | POA: Diagnosis present

## 2015-06-22 DIAGNOSIS — Z79899 Other long term (current) drug therapy: Secondary | ICD-10-CM

## 2015-06-22 DIAGNOSIS — D649 Anemia, unspecified: Secondary | ICD-10-CM | POA: Diagnosis present

## 2015-06-22 DIAGNOSIS — I959 Hypotension, unspecified: Secondary | ICD-10-CM | POA: Diagnosis present

## 2015-06-22 DIAGNOSIS — I341 Nonrheumatic mitral (valve) prolapse: Secondary | ICD-10-CM | POA: Diagnosis present

## 2015-06-22 DIAGNOSIS — R54 Age-related physical debility: Secondary | ICD-10-CM | POA: Diagnosis present

## 2015-06-22 DIAGNOSIS — R1110 Vomiting, unspecified: Secondary | ICD-10-CM

## 2015-06-22 DIAGNOSIS — F0390 Unspecified dementia without behavioral disturbance: Secondary | ICD-10-CM | POA: Diagnosis present

## 2015-06-22 DIAGNOSIS — R739 Hyperglycemia, unspecified: Secondary | ICD-10-CM | POA: Diagnosis present

## 2015-06-22 DIAGNOSIS — M810 Age-related osteoporosis without current pathological fracture: Secondary | ICD-10-CM | POA: Diagnosis present

## 2015-06-22 DIAGNOSIS — R570 Cardiogenic shock: Secondary | ICD-10-CM | POA: Diagnosis present

## 2015-06-22 LAB — COMPREHENSIVE METABOLIC PANEL
ALT: 67 U/L — ABNORMAL HIGH (ref 14–54)
ANION GAP: 5 (ref 5–15)
AST: 144 U/L — ABNORMAL HIGH (ref 15–41)
Albumin: 3.6 g/dL (ref 3.5–5.0)
Alkaline Phosphatase: 59 U/L (ref 38–126)
BUN: 41 mg/dL — ABNORMAL HIGH (ref 6–20)
CHLORIDE: 108 mmol/L (ref 101–111)
CO2: 26 mmol/L (ref 22–32)
Calcium: 10 mg/dL (ref 8.9–10.3)
Creatinine, Ser: 1.52 mg/dL — ABNORMAL HIGH (ref 0.44–1.00)
GFR calc non Af Amer: 29 mL/min — ABNORMAL LOW (ref >60)
GFR, EST AFRICAN AMERICAN: 34 mL/min — AB (ref >60)
Glucose, Bld: 147 mg/dL — ABNORMAL HIGH (ref 65–99)
POTASSIUM: 4.9 mmol/L (ref 3.5–5.1)
SODIUM: 139 mmol/L (ref 135–145)
Total Bilirubin: 0.6 mg/dL (ref 0.3–1.2)
Total Protein: 6.3 g/dL — ABNORMAL LOW (ref 6.5–8.1)

## 2015-06-22 LAB — CBC WITH DIFFERENTIAL/PLATELET
BASOS ABS: 0 10*3/uL (ref 0–0.1)
Basophils Relative: 0 %
EOS ABS: 0 10*3/uL (ref 0–0.7)
HCT: 28.6 % — ABNORMAL LOW (ref 35.0–47.0)
Hemoglobin: 8.4 g/dL — ABNORMAL LOW (ref 12.0–16.0)
LYMPHS ABS: 0.5 10*3/uL — AB (ref 1.0–3.6)
Lymphocytes Relative: 8 %
MCH: 24.7 pg — AB (ref 26.0–34.0)
MCHC: 29.3 g/dL — ABNORMAL LOW (ref 32.0–36.0)
MCV: 84.2 fL (ref 80.0–100.0)
MONO ABS: 0.3 10*3/uL (ref 0.2–0.9)
NEUTROS ABS: 5 10*3/uL (ref 1.4–6.5)
PLATELETS: 239 10*3/uL (ref 150–440)
RBC: 3.4 MIL/uL — AB (ref 3.80–5.20)
RDW: 27.8 % — AB (ref 11.5–14.5)
WBC: 5.8 10*3/uL (ref 3.6–11.0)

## 2015-06-22 LAB — URINALYSIS COMPLETE WITH MICROSCOPIC (ARMC ONLY)
Bilirubin Urine: NEGATIVE
Glucose, UA: NEGATIVE mg/dL
Ketones, ur: NEGATIVE mg/dL
NITRITE: NEGATIVE
PH: 5 (ref 5.0–8.0)
PROTEIN: 100 mg/dL — AB
SPECIFIC GRAVITY, URINE: 1.016 (ref 1.005–1.030)

## 2015-06-22 LAB — PROTIME-INR
INR: 1.18
PROTHROMBIN TIME: 15.2 s — AB (ref 11.4–15.0)

## 2015-06-22 LAB — TYPE AND SCREEN
ABO/RH(D): O POS
Antibody Screen: NEGATIVE

## 2015-06-22 LAB — APTT: aPTT: 29 seconds (ref 24–36)

## 2015-06-22 LAB — LACTIC ACID, PLASMA: LACTIC ACID, VENOUS: 4.2 mmol/L — AB (ref 0.5–2.0)

## 2015-06-22 LAB — TROPONIN I: TROPONIN I: 12.37 ng/mL — AB (ref <0.031)

## 2015-06-22 LAB — GLUCOSE, CAPILLARY: Glucose-Capillary: 135 mg/dL — ABNORMAL HIGH (ref 65–99)

## 2015-06-22 MED ORDER — SODIUM CHLORIDE 0.9 % IV BOLUS (SEPSIS)
1000.0000 mL | Freq: Once | INTRAVENOUS | Status: AC
  Administered 2015-06-22: 1000 mL via INTRAVENOUS

## 2015-06-22 MED ORDER — VANCOMYCIN HCL IN DEXTROSE 750-5 MG/150ML-% IV SOLN
750.0000 mg | Freq: Once | INTRAVENOUS | Status: AC
  Administered 2015-06-22: 750 mg via INTRAVENOUS
  Filled 2015-06-22: qty 150

## 2015-06-22 MED ORDER — NOREPINEPHRINE BITARTRATE 1 MG/ML IV SOLN
0.0000 ug/min | Freq: Once | INTRAVENOUS | Status: AC
  Administered 2015-06-22: 10 ug/min via INTRAVENOUS
  Filled 2015-06-22: qty 4

## 2015-06-22 MED ORDER — DEXTROSE 5 % IV SOLN: 500.0000 mg | INTRAVENOUS | Status: DC

## 2015-06-22 MED ORDER — HEPARIN SODIUM (PORCINE) 5000 UNIT/ML IJ SOLN: 5000.0000 [IU] | Freq: Two times a day (BID) | INTRAMUSCULAR | Status: DC

## 2015-06-22 MED ORDER — TICAGRELOR 90 MG PO TABS
ORAL_TABLET | ORAL | Status: AC
  Filled 2015-06-22: qty 2

## 2015-06-22 MED ORDER — ASPIRIN 300 MG RE SUPP
RECTAL | Status: AC
  Administered 2015-06-22: 300 mg via RECTAL
  Filled 2015-06-22: qty 1

## 2015-06-22 MED ORDER — ASPIRIN 300 MG RE SUPP
300.0000 mg | Freq: Once | RECTAL | Status: AC
  Administered 2015-06-22: 300 mg via RECTAL

## 2015-06-22 MED ORDER — SODIUM CHLORIDE 0.9% FLUSH: 3.0000 mL | Freq: Two times a day (BID) | INTRAVENOUS | Status: DC

## 2015-06-22 MED ORDER — PANTOPRAZOLE SODIUM 40 MG IV SOLR: 40.0000 mg | INTRAVENOUS | Status: DC

## 2015-06-22 MED ORDER — BIVALIRUDIN 250 MG IV SOLR
INTRAVENOUS | Status: AC
  Filled 2015-06-22: qty 250

## 2015-06-22 MED ORDER — NOREPINEPHRINE BITARTRATE 1 MG/ML IV SOLN: 0.0000 ug/min | Freq: Once | INTRAVENOUS | Status: DC

## 2015-06-22 MED ORDER — SODIUM CHLORIDE 0.9% FLUSH
3.0000 mL | INTRAVENOUS | Status: DC | PRN
Start: 2015-06-22 — End: 2015-06-22

## 2015-06-22 MED ORDER — SODIUM CHLORIDE 0.9 % IV SOLN
250.0000 mL | INTRAVENOUS | Status: DC | PRN
Start: 2015-06-22 — End: 2015-06-22

## 2015-06-22 MED ORDER — NOREPINEPHRINE 4 MG/250ML-% IV SOLN
INTRAVENOUS | Status: AC
  Administered 2015-06-22: 4 mg
  Filled 2015-06-22: qty 250

## 2015-06-22 MED ORDER — ASPIRIN 81 MG PO CHEW: 324.0000 mg | CHEWABLE_TABLET | Freq: Once | ORAL | Status: DC

## 2015-06-22 MED ORDER — HEPARIN (PORCINE) IN NACL 2-0.9 UNIT/ML-% IJ SOLN
INTRAMUSCULAR | Status: AC
  Filled 2015-06-22: qty 1000

## 2015-06-22 MED ORDER — ASPIRIN 300 MG RE SUPP
300.0000 mg | Freq: Every day | RECTAL | Status: DC
Start: 2015-06-23 — End: 2015-06-22

## 2015-06-22 MED ORDER — MORPHINE SULFATE (PF) 2 MG/ML IV SOLN: 1.0000 mg | INTRAVENOUS | Status: DC | PRN

## 2015-06-22 MED ORDER — SODIUM CHLORIDE 0.9 % IV SOLN: 250.0000 mL | INTRAVENOUS | Status: DC | PRN

## 2015-06-22 MED ORDER — PIPERACILLIN-TAZOBACTAM 3.375 G IVPB 30 MIN
3.3750 g | Freq: Once | INTRAVENOUS | Status: AC
  Administered 2015-06-22: 3.375 g via INTRAVENOUS
  Filled 2015-06-22: qty 50

## 2015-06-22 MED ORDER — NITROGLYCERIN 5 MG/ML IV SOLN
INTRAVENOUS | Status: AC
Start: 2015-06-22 — End: 2015-06-22
  Filled 2015-06-22: qty 10

## 2015-06-22 MED ORDER — DEXTROSE 5 % IV SOLN: 500.0000 mg | Freq: Once | INTRAVENOUS | Status: DC

## 2015-06-22 MED ORDER — MIDAZOLAM HCL 2 MG/2ML IJ SOLN
INTRAMUSCULAR | Status: AC
  Filled 2015-06-22: qty 2

## 2015-06-22 MED ORDER — NOREPINEPHRINE 4 MG/250ML-% IV SOLN: 0.0000 ug/min | Freq: Once | INTRAVENOUS | Status: DC

## 2015-06-22 MED ORDER — FENTANYL CITRATE (PF) 100 MCG/2ML IJ SOLN
INTRAMUSCULAR | Status: AC
  Filled 2015-06-22: qty 2

## 2015-06-23 LAB — URINE CULTURE

## 2015-06-23 LAB — ABO/RH: ABO/RH(D): O POS

## 2015-06-27 LAB — CULTURE, BLOOD (ROUTINE X 2)
CULTURE: NO GROWTH
Culture: NO GROWTH

## 2015-07-13 NOTE — ED Notes (Signed)
Pt from [facility], reports she had a fall yesterday and was seen here, now presents with vomiting and drowsiness per staff. Hx of dementia and oriented to person only

## 2015-07-13 NOTE — Progress Notes (Signed)
Pharmacy Antibiotic Note- Cefepime Day 1  Colleen Miranda is a 82 y.o. female admitted on 09/08/2015 with UTI.  Pharmacy has been consulted for cefepime dosing. Patient received vancomycin and Zosyn in ED x 1.    Plan: Will start patient on cefepime 500mg  IV Q24hr. Will schedule initial dose at 1800 ~ 6 hours after completion on Zosyn dose.     Temp (24hrs), Avg:95.7 F (35.4 C), Min:94.8 F (34.9 C), Max:96.2 F (35.7 C)   Recent Labs Lab 03-28-2015 1205 03-28-2015 1238  WBC 5.8  --   CREATININE 1.52*  --   LATICACIDVEN  --  4.2*    Estimated Creatinine Clearance: 16.3 mL/min (by C-G formula based on Cr of 1.52).    Allergies  Allergen Reactions  . Naprosyn [Naproxen] Other (See Comments)    Reaction:  Dizziness     Antimicrobials this admission: Zosyn 4/10 >> 4/10 Vancomycin 4/10 >>4/10  Cefepime 4/10 >>   Dose adjustments this admission: Cefepime 500mg  IV Q24hr; renally adjusted from Cefepime 1g IV Q12hr.   Microbiology results: 4/10 BCx: pending  4/10 UCx: pending   Pharmacy will continue to monitor and adjust per consult.     Stclair Szymborski L 09/08/2015 2:32 PM

## 2015-07-13 NOTE — ED Notes (Signed)
Pt desatting, placed on non-rebreather, EDP aware.

## 2015-07-13 NOTE — H&P (Signed)
PULMONARY / CRITICAL CARE MEDICINE   Name: Colleen Miranda MRN: [id number] DOB: 10/24/1925    ADMISSION DATE:  2015/07/25  HISTORY OF PRESENT ILLNESS:   [age] F NH resident with advanced dementia sent to ED with AMS and hypotension. In ED, hypotension has persisted despite 2 liters NS. Her EKG is abnormal and her troponin I is elevated. Norepinephrine infusion has been initiated by Ed and therefore PCCm is called for admission. Ed physician has spoken to pt's so who is HCPOA and he has made her DNR in event of cardiac arrest and DNI in event of acute respiratory failure.   PAST MEDICAL HISTORY :  She  has a past medical history of Arthritis; Osteoporosis; Venous stasis; GERD (gastroesophageal reflux disease); Migraine; Allergic rhinitis; Anemia; Edema; Hypertension; Palpitations; MVP (mitral valve prolapse); Anxiety; Corns and callosities; Carpal tunnel syndrome; and Hypercalcemia.  PAST SURGICAL HISTORY: She  has no past surgical history on file.  Allergies  Allergen Reactions  . Naprosyn [Naproxen] Other (See Comments)    Reaction:  Dizziness     No current facility-administered medications on file prior to encounter.   Current Outpatient Prescriptions on File Prior to Encounter  Medication Sig  . Magnesium Sulfate (EPSOM SALT) POWD 1 application by Does not apply route at bedtime. Pt is to soak feet in warm water with epsom salt for 20 minutes.  . metoprolol succinate (TOPROL-XL) 25 MG 24 hr tablet Take 1 tablet (25 mg total) by mouth daily.    FAMILY HISTORY:  Her has no family status information on file.   SOCIAL HISTORY: NH resident. Baseline is oriented X 1. Prior smoking history unknown. Prior occupational history unknown  REVIEW OF SYSTEMS:   Level 5 caveat  SUBJECTIVE:   VITAL SIGNS: BP 60/34 mmHg  Pulse 70  Temp(Src) 96.2 F (35.7 C)  Resp 15  SpO2 97%  LMP  (LMP Unknown)  HEMODYNAMICS:    VENTILATOR SETTINGS:    INTAKE / OUTPUT:     PHYSICAL EXAMINATION: General: RASS -1. Very frail. Oriented to person only. Appears dyspneic Neuro: CNs intact. MAEs HEENT: NCAT Cardiovascular: reg, III/VI systolic murmur Lungs: coarse diffuse rhonchi Abdomen: soft, NABS Ext: bilateral edema  LABS:  BMET  Recent Labs Lab May 05, 2015 1205  NA 139  K 4.9  CL 108  CO2 26  BUN 41*  CREATININE 1.52*  GLUCOSE 147*    Electrolytes  Recent Labs Lab May 05, 2015 1205  CALCIUM 10.0    CBC  Recent Labs Lab May 05, 2015 1205  WBC 5.8  HGB 8.4*  HCT 28.6*  PLT 239    Coag's  Recent Labs Lab May 05, 2015 1205  APTT 29  INR 1.18    Sepsis Markers  Recent Labs Lab May 05, 2015 1238  LATICACIDVEN 4.2*    ABG No results for input(s): PHART, PCO2ART, PO2ART in the last 168 hours.  Liver Enzymes  Recent Labs Lab May 05, 2015 1205  AST 144*  ALT 67*  ALKPHOS 59  BILITOT 0.6  ALBUMIN 3.6    Cardiac Enzymes  Recent Labs Lab May 05, 2015 1205  TROPONINI 12.37*    Glucose  Recent Labs Lab May 05, 2015 1216  GLUCAP 135*    CXR: CM, edema pattern    ASSESSMENT / PLAN: Frail, elderly woman with advanced dementia who is now critically ill with acute hypoxic respiratory failure, refractory shock, acute MI, LBBB pattern on CXR, AKI, pyuria. I suspect the primary event is acute MI complicated by cardiogenic pulmonary edema and shock  PULMONARY A: Acute hypoxic respiratory failure  Pulmonary edema P:   Supplemental O2 DNI  CARDIOVASCULAR A:  Shock - suspect cardiogenic Acute MI LBBB P:  No more IVFs Norepinephrine - max dose of 10 mcg/min Pt is not a candidate for CVL placement  RENAL A:   AKI P:   Monitor BMET intermittently Monitor I/Os Correct electrolytes as indicated Not a candidate for HD  GASTROINTESTINAL A:   Chronic PPI threrapy P:   SUP: IV PPI NPO for now  HEMATOLOGIC A:   Anemia - likely acute P:  DVT px: SQ heparin Monitor CBC intermittently Transfuse per usual  guidelines  INFECTIOUS A:   Pyuria P:   Empiric cefepime ordered  ENDOCRINE A:   Mild stress induced hyperglycemia P:   Monitor   NEUROLOGIC A:   End stage dementia Acute TME P:   RASS goal: 0 PRN morphine for any form of discomfort   FAMILY  - Updates: I spoke with pt's son over phone. I have informed him of his mother's critical illness and that it seems unlikely that she will survive this hospitalization or even this day. He expresses a desire that her dignity be maintained and that she not suffer. He reaffirmed DNR/DNI status   CCM time: 35 mins The above time includes time spent in consultation with patient and/or family members and reviewing care plan on multidisciplinary rounds  Billy Fischer, MD PCCM service Mobile [phone] Pager [phone]     06-27-2015, 2:37 PM

## 2015-07-13 NOTE — Discharge Summary (Signed)
DEATH SUMMARY  DATE OF ADMISSION:  08-30-2015  DATE OF DISCHARGE/DEATH:  08-30-2015  ADMISSION DIAGNOSES:   Acute hypoxic respiratory failure Pulmonary edema Shock - suspect cardiogenic Acute MI LBBB  AKI Anemia - likely acute  Pyuria Mild stress induced hyperglycemia End stage dementia Acute TME   DISCHARGE DIAGNOSES:   Acute hypoxic respiratory failure Pulmonary edema Shock Acute MI LBBB  AKI Anemia - likely acute  Pyuria Mild stress induced hyperglycemia End stage dementia Acute TME   PRESENTATION:   Pt was admitted with the following HPI and the above admission diagnoses:  HISTORY OF PRESENT ILLNESS:  Colleen Miranda with advanced dementia sent to ED with AMS and hypotension. In ED, hypotension has persisted despite 2 liters NS. Her EKG is abnormal and her troponin I is elevated. Norepinephrine infusion has been initiated by ED physician and therefore PCCM is called for admission. ED physician has spoken to pt's so who is HCPOA and he has made her DNR in event of cardiac arrest and DNI in event of acute respiratory failure  HOSPITAL COURSE:   In addition to vasopressors and supplemental O2, low dose morphine was ordered for respiratory distress. The patientt suffered progressive hypoxemia and refractory shock and subsequently passed away peacefully while still in the ED    Cause of death:  Cardiogenic shock due to AMI  Contributing factors: Cardiogenic pulmonary edema Advanced dementia  Autopsy: no  Smoking: no    Billy Fischeravid Simonds, MD PCCM service Mobile [phone] Pager [phone] 06/23/2015

## 2015-07-13 NOTE — ED Notes (Signed)
Given verbal orders by EDP to initiate levophed drip at 194mcg/min through pts 18G IV.

## 2015-07-13 NOTE — ED Provider Notes (Addendum)
[facility] Emergency Department Provider Note  ____________________________________________   I have reviewed the triage vital signs and the nursing notes.   HISTORY  Chief Complaint Emesis    HPI Colleen Miranda is a 82 y.o. female who presents today with low blood pressure. Patient is at baseline demented and alert and oriented 1 according to prior notes, she knows her name. She can ask me for a pillow she denies any chest pain shortness of breath or other symptoms.  Level V chart caveat  Patient cannot give a history otherwise. The patient's history is per EMS which was that she has been vomiting and less active today. The patient's son was also contacted immediately, he states that they are going to evaluate between him and his brother who are POA, her CODE STATUS   Past Medical History  Diagnosis Date  . Arthritis   . Osteoporosis   . Venous stasis   . GERD (gastroesophageal reflux disease)   . Migraine   . Allergic rhinitis   . Anemia   . Edema   . Hypertension   . Palpitations   . MVP (mitral valve prolapse)   . Anxiety   . Corns and callosities   . Carpal tunnel syndrome   . Hypercalcemia     There are no active problems to display for this patient.   History reviewed. No pertinent past surgical history.  Current Outpatient Rx  Name  Route  Sig  Dispense  Refill  . acetaminophen (TYLENOL) 325 MG tablet   Oral   Take 325 mg by mouth every 6 (six) hours as needed for mild pain, fever or headache.         Marland Kitchen aspirin EC 81 MG tablet   Oral   Take 81 mg by mouth daily.         . cholecalciferol (VITAMIN D) 400 units TABS tablet   Oral   Take 400 Units by mouth.         . furosemide (LASIX) 20 MG tablet   Oral   Take 1 tablet (20 mg total) by mouth daily. Patient taking differently: Take 20 mg by mouth every other day.    30 tablet   7   . lisinopril (PRINIVIL,ZESTRIL) 5 MG tablet   Oral   Take 1 tablet  (5 mg total) by mouth daily.   30 tablet   7   . Magnesium Sulfate (EPSOM SALT) POWD   Does not apply   1 application by Does not apply route daily. Soak feet with warm water for 20 minutes         . Menthol, Topical Analgesic, 5 % PADS   Apply externally   Apply 1 patch topically 4 (four) times daily.         . metoprolol succinate (TOPROL-XL) 25 MG 24 hr tablet   Oral   Take 1 tablet (25 mg total) by mouth daily.   30 tablet   7   . omeprazole (PRILOSEC OTC) 20 MG tablet   Oral   Take 1 tablet (20 mg total) by mouth daily.   30 tablet   7     Allergies Naprosyn  Family History  Problem Relation Age of Onset  . Family history unknown: Yes    Social History Social History  Substance Use Topics  . Smoking status: None  . Smokeless tobacco: None  . Alcohol Use: None    Review of Systems Cannot obtain secondary patient dementia.  ____________________________________________   PHYSICAL EXAM:  VITAL SIGNS: ED Triage Vitals  Enc Vitals Group     BP 06/08/15 1215 63/52 mmHg     Pulse Rate 06/08/15 1200 54     Resp 06/08/15 1200 13     Temp --      Temp src --      SpO2 06/08/15 1200 96 %     Weight --      Height --      Head Cir --      Peak Flow --      Pain Score --      Pain Loc --      Pain Edu? --      Excl. in GC? --     Constitutional: Alert and oriented to name and place, no acute distress Eyes: Conjunctivae are normal. PERRL. EOMI. Head: Atraumatic. Nose: No congestion/rhinnorhea. Mouth/Throat: Mucous membranes are moist.  Oropharynx non-erythematous. Neck: No stridor.   Nontender with no meningismus Cardiovascular: Normal rate, regular rhythm. Grossly normal heart sounds.  Faint pulses. Respiratory: Normal respiratory effort.  No retractions. Lungs slightly diminished in the bases. Abdominal: Soft and nontender. No distention. No guarding no rebound Back:  There is no focal tenderness or step off there is no midline tenderness there  are no lesions noted. there is no CVA tenderness  Rectal exam, guaiac negative Musculoskeletal: No lower extremity tenderness. No joint effusions, no DVT signs strong distal pulses chronic appearing ble edema Neurologic:  No gross focal neurologic deficits are appreciated.  Skin:  Skin is warm, dry and intact. No rash noted. Psychiatric: Mood and affect are normal. Speech and behavior are normal.  ____________________________________________   LABS (all labs ordered are listed, but only abnormal results are displayed)  Labs Reviewed  COMPREHENSIVE METABOLIC PANEL - Abnormal; Notable for the following:    Glucose, Bld 147 (*)    BUN 41 (*)    Creatinine, Ser 1.52 (*)    Total Protein 6.3 (*)    AST 144 (*)    ALT 67 (*)    GFR calc non Af Amer 29 (*)    GFR calc Af Amer 34 (*)    All other components within normal limits  CBC WITH DIFFERENTIAL/PLATELET - Abnormal; Notable for the following:    RBC 3.40 (*)    Hemoglobin 8.4 (*)    HCT 28.6 (*)    MCH 24.7 (*)    MCHC 29.3 (*)    RDW 27.8 (*)    Lymphs Abs 0.5 (*)    All other components within normal limits  TROPONIN I - Abnormal; Notable for the following:    Troponin I 12.37 (*)    All other components within normal limits  GLUCOSE, CAPILLARY - Abnormal; Notable for the following:    Glucose-Capillary 135 (*)    All other components within normal limits  LACTIC ACID, PLASMA - Abnormal; Notable for the following:    Lactic Acid, Venous 4.2 (*)    All other components within normal limits  CULTURE, BLOOD (ROUTINE X 2)  CULTURE, BLOOD (ROUTINE X 2)  URINE CULTURE  URINALYSIS COMPLETEWITH MICROSCOPIC ([facility] ONLY)  LACTIC ACID, PLASMA   ____________________________________________  EKG  I personally interpreted any EKGs ordered by me or triage 2 different EKGs were obtained. The first shows a known left bundle-branch block, rate 78 bpm, no significant ST elevation is noted. Flipped T waves laterally are old. No  significant change from prior. Second EKG, rate 78 bpm,  left bundle branch block again noted, no acute STEMI ____________________________________________  RADIOLOGY  I reviewed any imaging ordered by me or triage that were performed during my shift and, if possible, patient and/or family made aware of any abnormal findings. ____________________________________________   PROCEDURES  Procedure(s) performed: None  Critical Care performed: CRITICAL CARE Performed by: Jeanmarie Plant   Total critical care time: 120 minutes  Critical care time was exclusive of separately billable procedures and treating other patients.  Critical care was necessary to treat or prevent imminent or life-threatening deterioration.  Critical care was time spent personally by me on the following activities: development of treatment plan with patient and/or surrogate as well as nursing, discussions with consultants, evaluation of patient's response to treatment, examination of patient, obtaining history from patient or surrogate, ordering and performing treatments and interventions, ordering and review of laboratory studies, ordering and review of radiographic studies, pulse oximetry and re-evaluation of patient's condition.   ____________________________________________   INITIAL IMPRESSION / ASSESSMENT AND PLAN / ED COURSE  Pertinent labs & imaging results that were available during my care of the patient were reviewed by me and considered in my medical decision making (see chart for details).   Patient at this time appears to be in cardiogenic shock, multiple different pathologies were considered including septic shock and GI bleed for this profoundly low blood pressure refractory to IV fluids. However, with a troponin of 12 I do suspect the patient has had an MI which is responsible. I have tried to contact cardiology and we are working on a plan for that. Extensive discussions with her son Peggyann Shoals, who is  POA,  indicate that this time the patient is a DNR/DNI. They do not wish the patient to go on life support nor do they wish the patient to be a recipient of CPR. I do not think this is unreasonable. They feel that this is in her best interest given her history of quality of life deterioration. They do understand that there is a strong chance the patient will die irrespective of what we do here today. I have initiated pressors given her chest x-ray which shows some pulmonary edema, we will use norepinephrine as it is less likely to be dysrhythmic genic and we will consult with cardiology what other steps they would like to take.   Pt has a naproxen allergy which causes dizziness, which is not a counter indication to asa.   ----------------------------------------- 1:29 PM on 2015-07-15 -----------------------------------------  Paged Dr Cassie Freer who is in a procedure. Contacting dr. Kirke Corin.  ----------------------------------------- 1:50 PM on 07-15-15 -----------------------------------------  Discussed with Dr. Juliann Pares, who was kind enough to offer a consult he did evaluate the patient and her EKGs blood work as and imaging, and he does not feel that the patient at this time is a good candidate for an emergent heart catheter. He agrees with her management thus far. I also discussed with Dr. Melvenia Beam of the ICU, who similarly agrees and will come and assume care of the patient.  ----------------------------------------- 3:08 PM on 07-15-15 -----------------------------------------  Patient had been evaluated by ICU as well as cardiology. Unfortunately, she did pass. She died at 74. Patient had persistent worsening cardiovascular parameters despite pressors and ultimately passed peacefully. Given my discussion with the family, I was not in a position therefore to institute CPR or intubation as this is not what the patient would've wanted. We did make sure she was comfortable and she did not  suffer. The patient died at  3:04 PM. I verified it with ultrasound which showed no cardiac activity, no pulses were noted, and she was in asystole on the monitor with no pupillary reflexes.   ____________________________________________   FINAL CLINICAL IMPRESSION(S) / ED DIAGNOSES  Final diagnoses:  Vomiting  Vomiting      This chart was dictated using voice recognition software.  Despite best efforts to proofread,  errors can occur which can change meaning.     Jeanmarie Plant, MD 07/16/15 1334  Jeanmarie Plant, MD Jul 16, 2015 1335  Jeanmarie Plant, MD 16-Jul-2015 1350  Jeanmarie Plant, MD 07-16-15 1351  Jeanmarie Plant, MD 07-16-15 1610  Jeanmarie Plant, MD 2015-07-16 [phone]

## 2015-07-13 NOTE — ED Notes (Signed)
Verbal order given for DNR from EDP McShane, per son

## 2015-07-13 NOTE — ED Notes (Addendum)
Notified of elevated troponin and lactic, EDP made aware. Oncall STEMI paged for Dr.McShane. Temp foley placed

## 2015-07-13 NOTE — ED Notes (Addendum)
MD at bedsidde, aware of BP, 2 fluid boluses being run at this time on pressure bags. 2L o2 started. Pt placed in trendelenburg's position

## 2015-07-13 NOTE — ED Notes (Signed)
EDP notified of Pts temp. Warm blankets given at this time

## 2015-07-13 NOTE — ED Notes (Signed)
Orderly present to pick up pt

## 2015-07-13 NOTE — ED Notes (Signed)
Family left, however received call that one more family member will be coming to visit pt

## 2015-07-13 NOTE — ED Notes (Signed)
Pt's family in family room.  Dr. Alphonzo LemmingsMcShane to be notified

## 2015-07-13 NOTE — Progress Notes (Signed)
   11-16-15 1500  Clinical Encounter Type  Visited With Patient  Visit Type Death  Referral From Nurse  Consult/Referral To Chaplain  Spiritual Encounters  Spiritual Needs Ritual;Prayer  Stress Factors  Patient Stress Factors Loss  Responded to page to patient's room. Patient's vital signs were fading. Offered extreme unction for the dying ('Last Rites'). Chap. Mance Vallejo G. Halcyon Heck, ext. [phone]

## 2015-07-13 NOTE — ED Notes (Addendum)
MD at bedside. No pulses palpated at this time. Chaplain at bedside

## 2015-07-13 NOTE — ED Notes (Signed)
Family remains at bedside, no needs stated at this time

## 2015-07-13 NOTE — ED Notes (Signed)
Dr. Alphonzo LemmingsMcshane spoke with family, informed them of pt's death.  Chaplain with family at this time.  Family taken to pt's room, chairs provided.

## 2015-07-13 NOTE — ED Notes (Signed)
Body placed in body bag, orderly called to pick up pt

## 2015-07-13 NOTE — ED Notes (Signed)
MD at bedside. 

## 2015-07-13 NOTE — ED Notes (Signed)
Charge nurse notified of pt death

## 2015-07-13 NOTE — ED Notes (Signed)
AC notified of pt death

## 2015-07-13 NOTE — ED Notes (Signed)
Pt HOB placed up, Breathing less labored, now 100% oxygen

## 2015-07-13 NOTE — ED Notes (Signed)
Hospitalist notified of pts runs of V-tach and oxygen levels, verbal orders given for comfort care and pts family contacted

## 2015-07-13 DEATH — deceased

## 2017-03-27 IMAGING — CT CT CERVICAL SPINE W/O CM
4 of 6 series · 13 of 33 positions shown, 15 images · non-contrast
Comparison: None.

CLINICAL DATA: Status post unwitnessed fall. Laceration at the back
of the head. Concern for cervical spine injury. Initial encounter.

EXAM:
CT HEAD WITHOUT CONTRAST
CT CERVICAL SPINE WITHOUT CONTRAST
TECHNIQUE: Multidetector CT imaging of the head and cervical spine was
performed following the standard protocol without intravenous
contrast. Multiplanar CT image reconstructions of the cervical spine
were also generated.

[Series 3: head bone · axial · 0.39mm/px · z∈[-181,-109]mm · 3 of 91 slices shown]
[im 19/91  bone]
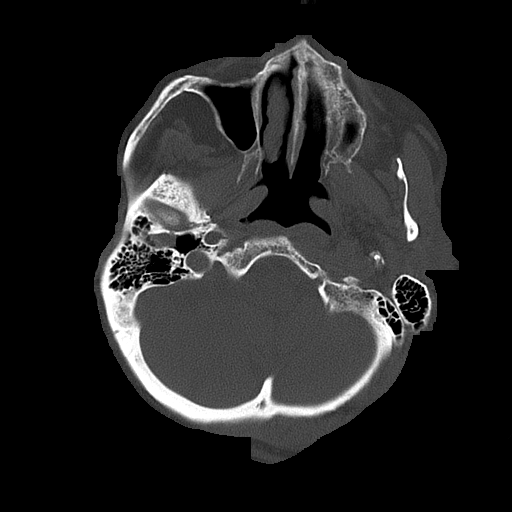
[im 37/91  bone]
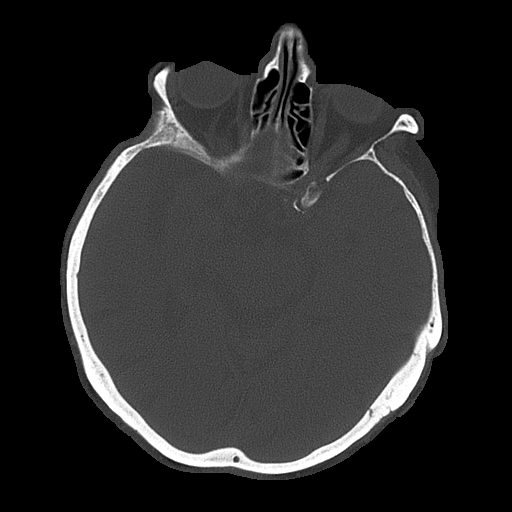
[im 55/91  bone]
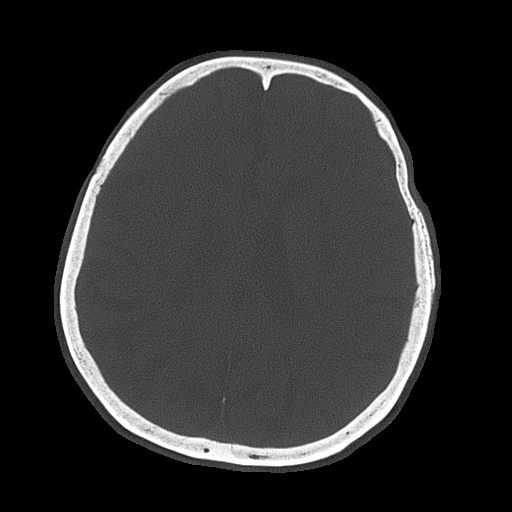

[Series 5: c spine soft · axial · 0.50mm/px · z∈[-246,-206]mm · 2 of 60 slices shown, 3 images]
[im 20/60  soft-tissue]
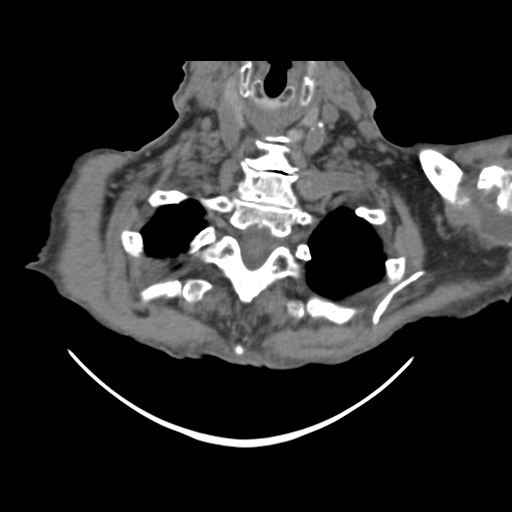
[im 20/60  bone]
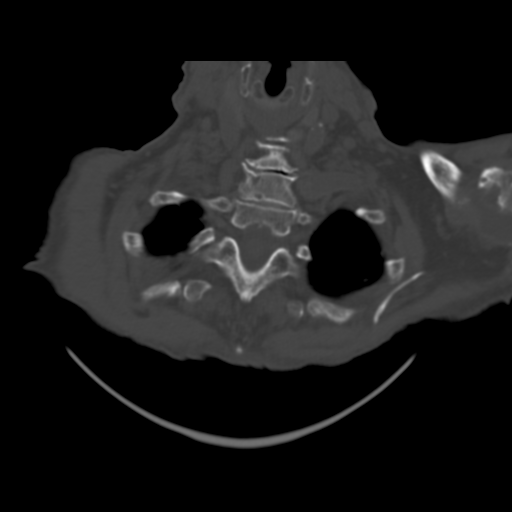
[im 40/60  bone]
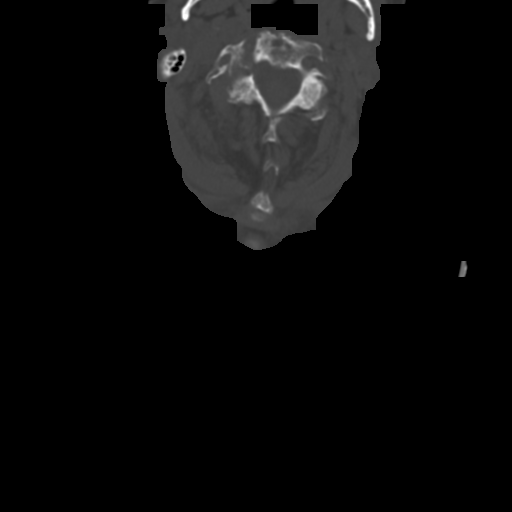

[Series 8: sag bone · sagittal · 0.29mm/px · 5 of 42 slices shown, 6 images]
[im 14/42  bone]
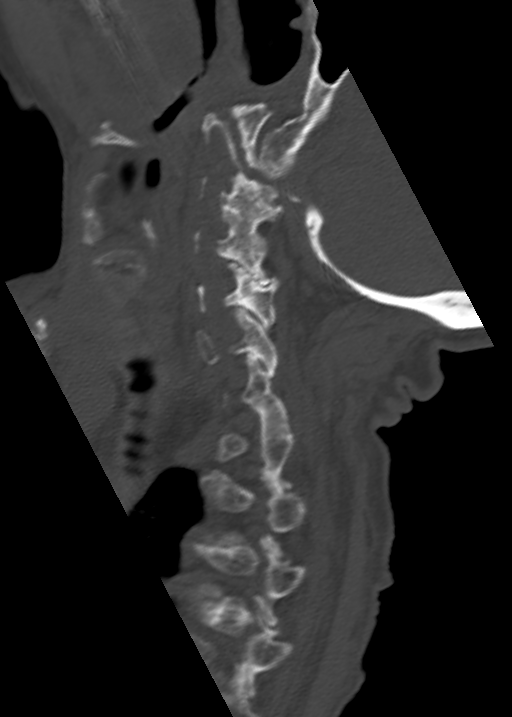
[im 18/42  bone]
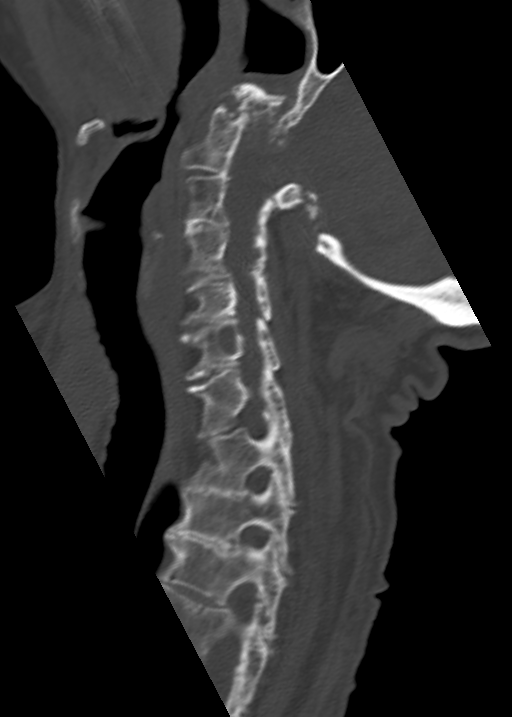
[im 21/42  soft-tissue]
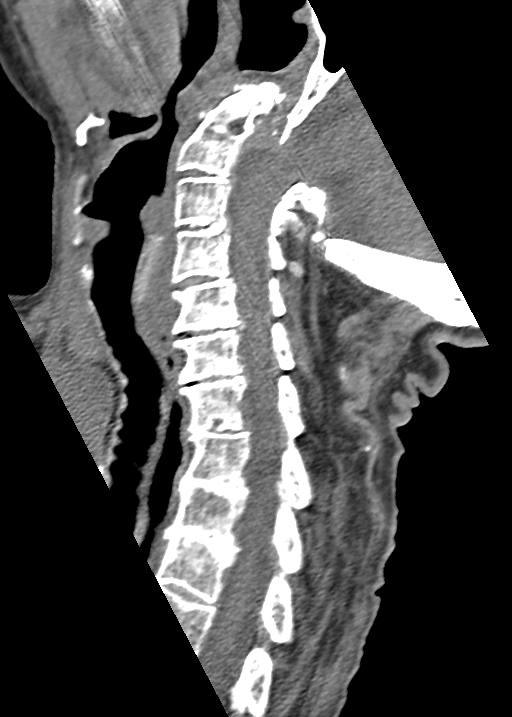
[im 21/42  bone]
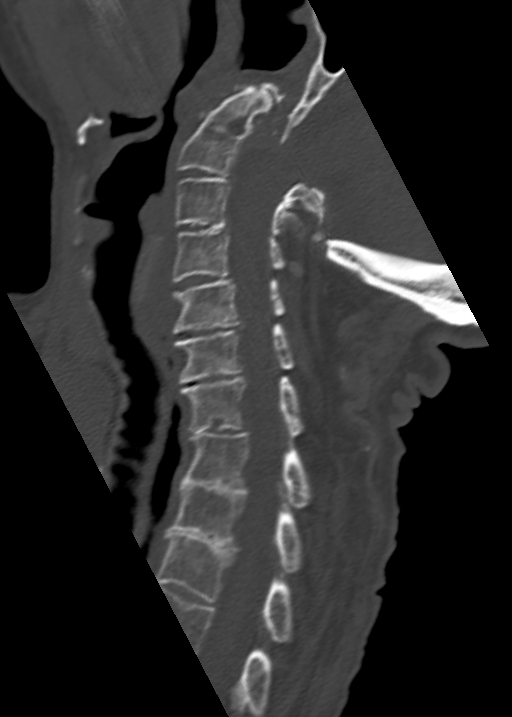
[im 24/42  bone]
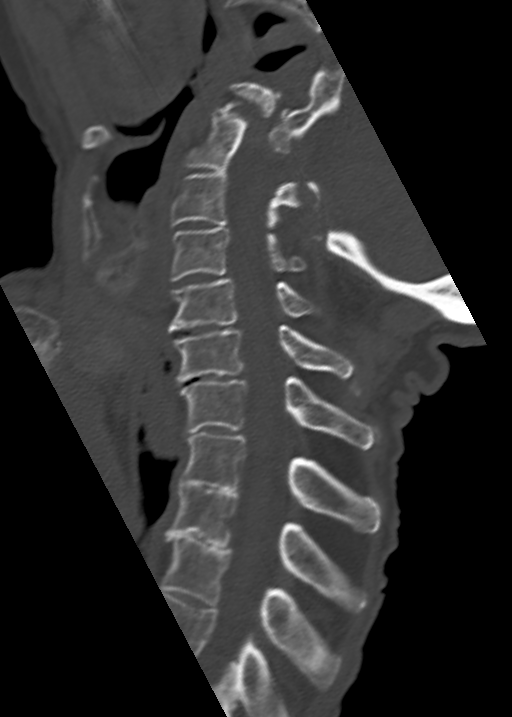
[im 28/42  bone]
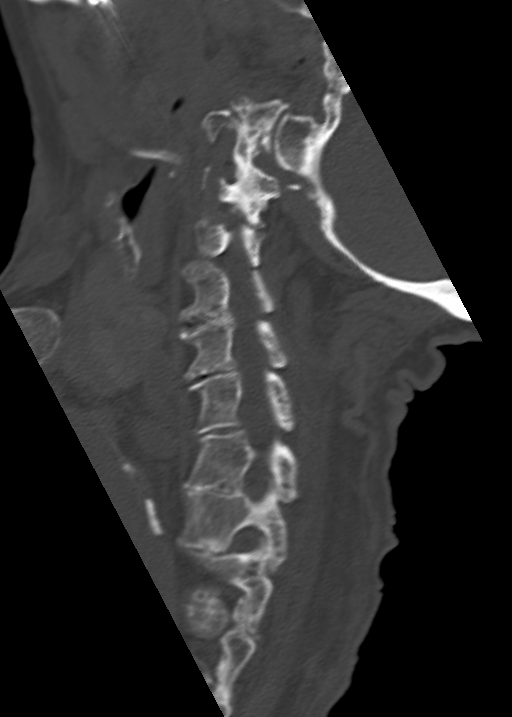

[Series 10: orthogonal axials · coronal · 0.29mm/px · 3 of 89 slices shown]
[im 18/89  bone]
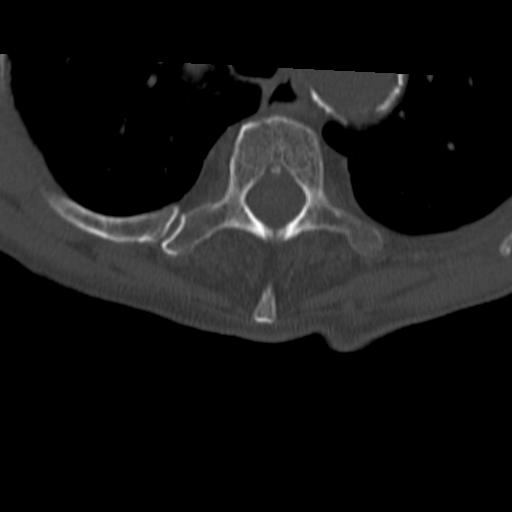
[im 36/89  bone]
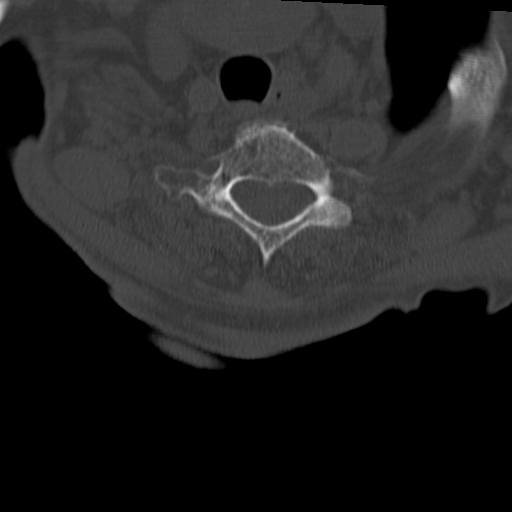
[im 53/89  bone]
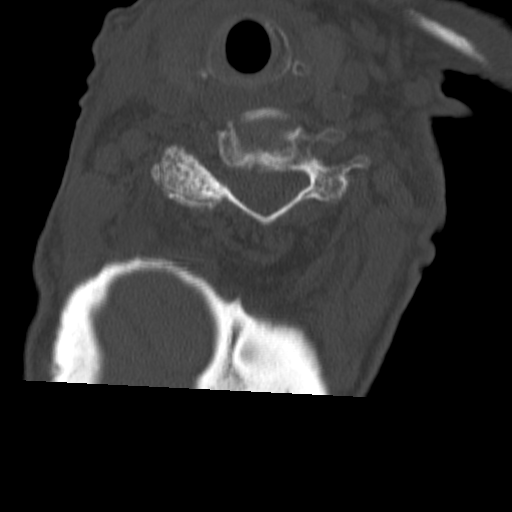

[13 of 33 positions shown; findings below may reference images not displayed]

FINDINGS: CT HEAD FINDINGS

There is no evidence of acute infarction, mass lesion, or intra- or
extra-axial hemorrhage on CT.

Prominence of the ventricles and sulci reflects mild to moderate
cortical volume loss. Cerebellar atrophy is noted. Diffuse
periventricular and subcortical white matter change likely reflects
small vessel ischemic microangiopathy.

The brainstem and fourth ventricle are within normal limits. The
basal ganglia are unremarkable in appearance. The cerebral
hemispheres demonstrate grossly normal gray-white differentiation.
No mass effect or midline shift is seen.

There is no evidence of fracture; visualized osseous structures are
unremarkable in appearance. The orbits are within normal limits. The
paranasal sinuses and mastoid air cells are well-aerated. Mild soft
tissue swelling is noted overlying the high left parietal calvarium.

CT CERVICAL SPINE FINDINGS

There is no evidence of acute fracture or subluxation. There is
chronic degeneration of C1, with fragmentation at the anterior
aspect of C1 and flattening on the right side of C1. Associated
chronic anterior subluxation of C1 is noted, with corresponding
narrowing of the spinal canal at the foramen magnum to 1.2 cm. There
is chronic fusion of the posterior elements of C2-C4 on both sides,
more prominent on the right, with a chronic right-sided pars defect
at C2.

Mild vacuum phenomenon is noted at the lower cervical spine. Mild
multilevel disc space narrowing is noted along the upper thoracic
spine. Underlying facet disease is noted at the mid to lower
cervical spine. Prevertebral soft tissues are within normal limits.

Multiple large nodules are seen within both thyroid lobes, measuring
up to 3.5 cm in size. Though this could reflect thyroid goiter,
underlying masses are a concern. The visualized lung apices are
clear. No significant soft tissue abnormalities are seen. Scattered
calcification is noted along the aortic arch. Mild calcification is
noted at the carotid bifurcations bilaterally.
IMPRESSION: 1. No evidence of traumatic intracranial injury or fracture.
2. No evidence of acute fracture or subluxation along the cervical
spine.
3. Mild soft tissue swelling overlying the high left parietal
calvarium.
4. Mild to moderate cortical volume loss and diffuse small vessel
ischemic microangiopathy.
5. Chronic degeneration of C1, with fragmentation at the anterior
aspect of C1 and flattening on the right side of C1. Associated
chronic anterior subluxation of C1 noted, with corresponding
narrowing of the spinal canal at the foramen magnum to 1.2 cm.
Chronic fusion of the posterior elements of C2-C4 on both sides,
more prominent on the right, with a chronic right-sided pars defect
at C2.
6. Mild diffuse degenerative change along the lower cervical and
upper thoracic spine.
7. Multiple large thyroid nodules noted, measuring up to 3.5 cm in
size. Recommend further evaluation with thyroid ultrasound, as
deemed clinically appropriate. If patient is clinically
hyperthyroid, consider nuclear medicine thyroid uptake and scan.
8. Mild calcification at the carotid bifurcations bilaterally.
